# Patient Record
Sex: Male | Born: 1950 | Race: Black or African American | Hispanic: No | Marital: Single | Smoking: Former smoker
Health system: Southern US, Community
[De-identification: ages and names within clinical notes are randomized; demographics above are authoritative.]

## PROBLEM LIST (undated history)

## (undated) DIAGNOSIS — D1779 Benign lipomatous neoplasm of other sites: Secondary | ICD-10-CM

## (undated) DIAGNOSIS — E538 Deficiency of other specified B group vitamins: Secondary | ICD-10-CM

## (undated) DIAGNOSIS — K5900 Constipation, unspecified: Secondary | ICD-10-CM

## (undated) DIAGNOSIS — K5730 Diverticulosis of large intestine without perforation or abscess without bleeding: Secondary | ICD-10-CM

## (undated) DIAGNOSIS — B182 Chronic viral hepatitis C: Secondary | ICD-10-CM

## (undated) DIAGNOSIS — E7800 Pure hypercholesterolemia, unspecified: Secondary | ICD-10-CM

## (undated) DIAGNOSIS — N400 Enlarged prostate without lower urinary tract symptoms: Secondary | ICD-10-CM

## (undated) DIAGNOSIS — I10 Essential (primary) hypertension: Secondary | ICD-10-CM

## (undated) DIAGNOSIS — E785 Hyperlipidemia, unspecified: Secondary | ICD-10-CM

## (undated) HISTORY — DX: Essential (primary) hypertension: I10

## (undated) HISTORY — DX: Pure hypercholesterolemia, unspecified: E78.00

## (undated) HISTORY — DX: Hyperlipidemia, unspecified: E78.5

## (undated) HISTORY — DX: Diverticulosis of large intestine without perforation or abscess without bleeding: K57.30

## (undated) HISTORY — DX: Chronic viral hepatitis C: B18.2

## (undated) HISTORY — DX: Constipation, unspecified: K59.00

## (undated) HISTORY — DX: Benign prostatic hyperplasia without lower urinary tract symptoms: N40.0

## (undated) HISTORY — DX: Deficiency of other specified B group vitamins: E53.8

## (undated) HISTORY — DX: Benign lipomatous neoplasm of other sites: D17.79

---

## 2006-05-11 ENCOUNTER — Emergency Department (HOSPITAL_COMMUNITY): Admission: EM | Admit: 2006-05-11 | Discharge: 2006-05-11 | Payer: Self-pay | Admitting: Emergency Medicine

## 2006-05-14 ENCOUNTER — Emergency Department (HOSPITAL_COMMUNITY): Admission: EM | Admit: 2006-05-14 | Discharge: 2006-05-14 | Payer: Self-pay | Admitting: Family Medicine

## 2018-04-12 ENCOUNTER — Ambulatory Visit: Payer: Self-pay | Admitting: Interventional Cardiology

## 2021-04-01 ENCOUNTER — Encounter: Payer: Self-pay | Admitting: Gastroenterology

## 2021-04-29 ENCOUNTER — Encounter: Payer: Self-pay | Admitting: Gastroenterology

## 2021-04-29 ENCOUNTER — Ambulatory Visit (INDEPENDENT_AMBULATORY_CARE_PROVIDER_SITE_OTHER): Payer: Medicare Other | Admitting: Gastroenterology

## 2021-04-29 ENCOUNTER — Other Ambulatory Visit: Payer: Self-pay

## 2021-04-29 ENCOUNTER — Other Ambulatory Visit (INDEPENDENT_AMBULATORY_CARE_PROVIDER_SITE_OTHER): Payer: Medicare Other

## 2021-04-29 VITALS — BP 158/82 | HR 71 | Ht 68.0 in | Wt 176.0 lb

## 2021-04-29 DIAGNOSIS — Z800 Family history of malignant neoplasm of digestive organs: Secondary | ICD-10-CM | POA: Diagnosis not present

## 2021-04-29 DIAGNOSIS — K581 Irritable bowel syndrome with constipation: Secondary | ICD-10-CM

## 2021-04-29 DIAGNOSIS — R109 Unspecified abdominal pain: Secondary | ICD-10-CM | POA: Diagnosis not present

## 2021-04-29 LAB — CBC WITH DIFFERENTIAL/PLATELET
Basophils Absolute: 0 10*3/uL (ref 0.0–0.1)
Basophils Relative: 0.6 % (ref 0.0–3.0)
Eosinophils Absolute: 0.1 10*3/uL (ref 0.0–0.7)
Eosinophils Relative: 2.6 % (ref 0.0–5.0)
HCT: 42.3 % (ref 39.0–52.0)
Hemoglobin: 14.5 g/dL (ref 13.0–17.0)
Lymphocytes Relative: 36.4 % (ref 12.0–46.0)
Lymphs Abs: 1.8 10*3/uL (ref 0.7–4.0)
MCHC: 34.3 g/dL (ref 30.0–36.0)
MCV: 96.5 fl (ref 78.0–100.0)
Monocytes Absolute: 0.6 10*3/uL (ref 0.1–1.0)
Monocytes Relative: 11.7 % (ref 3.0–12.0)
Neutro Abs: 2.4 10*3/uL (ref 1.4–7.7)
Neutrophils Relative %: 48.7 % (ref 43.0–77.0)
Platelets: 233 10*3/uL (ref 150.0–400.0)
RBC: 4.39 Mil/uL (ref 4.22–5.81)
RDW: 12.4 % (ref 11.5–15.5)
WBC: 5 10*3/uL (ref 4.0–10.5)

## 2021-04-29 LAB — COMPREHENSIVE METABOLIC PANEL
ALT: 18 U/L (ref 0–53)
AST: 21 U/L (ref 0–37)
Albumin: 4.5 g/dL (ref 3.5–5.2)
Alkaline Phosphatase: 56 U/L (ref 39–117)
BUN: 14 mg/dL (ref 6–23)
CO2: 28 mEq/L (ref 19–32)
Calcium: 9.8 mg/dL (ref 8.4–10.5)
Chloride: 104 mEq/L (ref 96–112)
Creatinine, Ser: 0.88 mg/dL (ref 0.40–1.50)
GFR: 86.3 mL/min (ref >60.00)
Glucose, Bld: 80 mg/dL (ref 70–99)
Potassium: 4.2 mEq/L (ref 3.5–5.1)
Sodium: 140 mEq/L (ref 135–145)
Total Bilirubin: 1 mg/dL (ref 0.2–1.2)
Total Protein: 7.1 g/dL (ref 6.0–8.3)

## 2021-04-29 LAB — TSH: TSH: 1.45 u[IU]/mL (ref 0.35–5.50)

## 2021-04-29 LAB — C-REACTIVE PROTEIN: CRP: <1 mg/dL (ref 0.5–20.0)

## 2021-04-29 NOTE — Patient Instructions (Signed)
If you are age 71 or older, your body mass index should be between 23-30. Your Body mass index is 26.76 kg/m. If this is out of the aforementioned range listed, please consider follow up with your Primary Care Provider.  If you are age 89 or younger, your body mass index should be between 19-25. Your Body mass index is 26.76 kg/m. If this is out of the aformentioned range listed, please consider follow up with your Primary Care Provider.   ________________________________________________________  The [place] [facility] providers would like to encourage you to use [facility] to communicate with providers for non-urgent requests or questions.  Due to long hold times on the telephone, sending your provider a message by [facility] may be a faster and more efficient way to get a response.  Please allow 48 business hours for a response.  Please remember that this is for non-urgent requests.  _______________________________________________________  Please go to the lab on the 2nd floor [address] before you leave the office today.   Please purchase the following medications over the counter and take as directed: Dulcolax Monday Wednesday and Friday  Recall colon set for 08-2024. We will send a letter as it gets closer but you can call 06-2024 to see if we have the schedule out  You have been scheduled for a CT scan of the abdomen and pelvis at [facility] [place], [place] [address] 1st flood Radiology).   Please call in 3 days to schedule CT You are scheduled on          at            . You should arrive 15 minutes prior to your appointment time for registration. Please follow the written instructions below on the day of your exam:  WARNING: IF YOU ARE ALLERGIC TO IODINE/X-RAY DYE, PLEASE NOTIFY RADIOLOGY IMMEDIATELY AT [phone]! YOU WILL BE GIVEN A 13 HOUR PREMEDICATION PREP.  1) Do not eat or drink anything after        (4 hours prior to your test) 2) You have been given 2 bottles  of oral contrast to drink. The solution may taste better if refrigerated, but do NOT add ice or any other liquid to this solution. Shake well before drinking.    Drink 1 bottle of contrast @        (2 hours prior to your exam)  Drink 1 bottle of contrast @        (1 hour prior to your exam)  You may take any medications as prescribed with a small amount of water, if necessary. If you take any of the following medications: METFORMIN, GLUCOPHAGE, GLUCOVANCE, AVANDAMET, RIOMET, FORTAMET, [place] MET, JANUMET, GLUMETZA or METAGLIP, you MAY be asked to HOLD this medication 48 hours AFTER the exam.  The purpose of you drinking the oral contrast is to aid in the visualization of your intestinal tract. The contrast solution may cause some diarrhea. Depending on your individual set of symptoms, you may also receive an intravenous injection of x-ray contrast/dye. Plan on being at [facility] for 30 minutes or longer, depending on the type of exam you are having performed.  This test typically takes 30-45 minutes to complete.  If you have any questions regarding your exam or if you need to reschedule, you may call the CT department at [phone] between the hours of 8:00 am and 5:00 pm, Monday-Friday.  ________________________________________________________________________    Thank you,  Dr. Jackquline Denmark

## 2021-04-29 NOTE — Progress Notes (Signed)
Chief Complaint: GI eval  Referring Provider:  No ref. provider found      ASSESSMENT AND PLAN;   #1. Lower Abdo pain   #2. IBS-C. Neg colon 08/2019 except for inflammatory polyp, melanosis  #3. FH of Colon Ca (mom 10)  Plan: -CT AP with contrast -CBC, CMP, TSH, celiac, CRP. -Dulcolax MWF (3/day) -Recall Colon 08/2024 -FU after above is complete.   HPI:    Angel Conner is a 70 y.o. male  With HCV s/p Harvoni with SVR 2015, HTN, HLD, B12 def  C/O abdominal pain-mostly in the lower abdomen associated with abdominal bloating and constipation.  Has BMs 1 every day or every other day with occasionally pellet-like stools.  Has sensation of incomplete evacuation.  Has been taking Dulcolax 1/week and using MiraLAX on as-needed basis.  No melena or hematochezia.  There is some change in stool caliber.  Abdominal pain does get worse at times, crampy, abdomen becomes tender to touch.  No fever or chills.  No recent weight loss.  No nausea or vomiting.  Does have problems when he takes gluten in form of increased abdominal bloating.  No history suggestive of lactose intolerance.  Negative colonoscopy 08/18/2019 as below.   Past GI work-up: Colon[facility]/18/2019 [facility] 8 mm sigmoid polyp s/p polypectomy (Bx-inflammatory), internal hemorrhoids, moderate melanosis coli in the entire colon. Rpt 5 Yrs d/t family history  H/O HCV (GT1, Gd2/stage1 on liver Bx 05/2007, failed vertex open label study (Tele/pegasys/ribavirin), then treated with Harvoni 08/2013 with SVR. Nl Plt/alb/no cirrhosis (Dr Maceo Pro @ [facility]) Past Medical History:  Diagnosis Date   High cholesterol    Hypertension     History reviewed. No pertinent surgical history.  Family History  Problem Relation Age of Onset   Colon cancer Mother    Diabetes Father    Rectal cancer Neg Hx    Esophageal cancer Neg Hx    Stomach cancer Neg Hx     Social History   Tobacco Use   Smoking status: Former     Types: Cigarettes   Smokeless tobacco: Never   Tobacco comments:    Stopped smoking 30 years ago - 04/29/2021  Vaping Use   Vaping Use: Never used  Substance Use Topics   Alcohol use: Never   Drug use: Never    Current Outpatient Medications  Medication Sig Dispense Refill   atorvastatin (LIPITOR) 10 MG tablet Take 1 tablet by mouth at bedtime.     cyanocobalamin (,VITAMIN B-12,) 1000 MCG/ML injection Once Monthly     Multiple Vitamin (MULTIVITAMIN) tablet Take 1 tablet by mouth daily.     Omega 3 1200 MG CAPS Take by mouth. Once daily     hydrochlorothiazide (MICROZIDE) 12.5 MG capsule 1 tablet in the morning     losartan (COZAAR) 50 MG tablet Take 50 mg by mouth daily.     tadalafil (CIALIS) 5 MG tablet Take 1 tablet by mouth daily.     No current facility-administered medications for this visit.    No Known Allergies  Review of Systems:  Constitutional: Denies fever, chills, diaphoresis, appetite change and fatigue.  HEENT: Denies photophobia, eye pain, redness, hearing loss, ear pain, congestion, sore throat, rhinorrhea, sneezing, mouth sores, neck pain, neck stiffness and tinnitus.   Respiratory: Denies SOB, DOE, cough, chest tightness,  and wheezing.   Cardiovascular: Denies chest pain, palpitations and leg swelling.  Genitourinary: Denies dysuria, urgency, frequency, hematuria, flank pain and difficulty urinating.  Musculoskeletal: Denies  myalgias, back pain, joint swelling, arthralgias and gait problem.  Skin: No rash.  Neurological: Denies dizziness, seizures, syncope, weakness, light-headedness, numbness and headaches.  Hematological: Denies adenopathy. Easy bruising, personal or family bleeding history  Psychiatric/Behavioral: No anxiety or depression     Physical Exam:    BP (!) 158/82   Pulse 71   Ht 5\' 8"  (1.727 m)   Wt 176 lb (79.8 kg)   SpO2 98%   BMI 26.76 kg/m  Wt Readings from Last 3 Encounters:  04/29/21 176 lb (79.8 kg)   Constitutional:   Well-developed, in no acute distress. Psychiatric: Normal mood and affect. Behavior is normal. HEENT: Pupils normal.  Conjunctivae are normal. No scleral icterus. Cardiovascular: Normal rate, regular rhythm. No edema Pulmonary/chest: Effort normal and breath sounds normal. No wheezing, rales or rhonchi. Abdominal: Soft, nondistended. Nontender. Bowel sounds active throughout. There are no masses palpable. No hepatomegaly. Rectal: Deferred Neurological: Alert and oriented to person place and time. Skin: Skin is warm and dry. No rashes noted.     Carmell Austria, MD 04/29/2021, 11:21 AM  Cc: No ref. provider found

## 2021-05-01 LAB — CELIAC PANEL 10
Antigliadin Abs, IgA: 5 units (ref 0–19)
Endomysial IgA: NEGATIVE
Gliadin IgG: 1 units (ref 0–19)
IgA/Immunoglobulin A, Serum: 128 mg/dL (ref 61–437)
Tissue Transglut Ab: 2 U/mL (ref 0–5)
Transglutaminase IgA: <2 U/mL (ref 0–3)

## 2021-05-03 ENCOUNTER — Ambulatory Visit (HOSPITAL_BASED_OUTPATIENT_CLINIC_OR_DEPARTMENT_OTHER)
Admission: RE | Admit: 2021-05-03 | Discharge: 2021-05-03 | Disposition: A | Discharge status: Home / Self Care | Payer: Medicare Other | Source: Ambulatory Visit | Attending: Gastroenterology | Admitting: Gastroenterology

## 2021-05-03 ENCOUNTER — Encounter (HOSPITAL_BASED_OUTPATIENT_CLINIC_OR_DEPARTMENT_OTHER): Payer: Self-pay

## 2021-05-03 ENCOUNTER — Other Ambulatory Visit: Payer: Self-pay

## 2021-05-03 DIAGNOSIS — R109 Unspecified abdominal pain: Secondary | ICD-10-CM | POA: Insufficient documentation

## 2021-05-03 DIAGNOSIS — K581 Irritable bowel syndrome with constipation: Secondary | ICD-10-CM | POA: Insufficient documentation

## 2021-05-03 MED ORDER — IOHEXOL 300 MG/ML  SOLN
85.0000 mL | Freq: Once | INTRAMUSCULAR | Status: AC | PRN
  Administered 2021-05-03: 85 mL via INTRAVENOUS

## 2021-05-16 ENCOUNTER — Encounter: Payer: Self-pay | Admitting: Gastroenterology

## 2021-05-16 ENCOUNTER — Ambulatory Visit (INDEPENDENT_AMBULATORY_CARE_PROVIDER_SITE_OTHER): Payer: Medicare Other | Admitting: Gastroenterology

## 2021-05-16 ENCOUNTER — Other Ambulatory Visit: Payer: Self-pay

## 2021-05-16 ENCOUNTER — Ambulatory Visit: Payer: Medicare Other | Admitting: Gastroenterology

## 2021-05-16 VITALS — BP 128/78 | HR 76 | Ht 68.0 in | Wt 178.0 lb

## 2021-05-16 DIAGNOSIS — K581 Irritable bowel syndrome with constipation: Secondary | ICD-10-CM | POA: Diagnosis not present

## 2021-05-16 DIAGNOSIS — Z800 Family history of malignant neoplasm of digestive organs: Secondary | ICD-10-CM

## 2021-05-16 DIAGNOSIS — R1030 Lower abdominal pain, unspecified: Secondary | ICD-10-CM

## 2021-05-16 NOTE — Progress Notes (Signed)
Chief Complaint: GI eval  Referring Provider:  No ref. provider found      ASSESSMENT AND PLAN;   #1. Lower Abdo pain (resolved). Neg CT  AP 04/2021  #2. IBS-C. Neg colon 08/2019 except for inflammatory polyp, melanosis  #3. FH of Colon Ca (mom 25)  Plan: -Dulcolax MWF (3/week) -Recall Colon 08/2024. -FU PRN   HPI:    Angel Conner is a 70 y.o. male  With HCV s/p Harvoni with SVR 2015, HTN, HLD, B12 def  Doing great.  Constipation is much better as long as he takes Dulcolax 3/week  No nausea, vomiting, heartburn, regurgitation, odynophagia or dysphagia.  No significant diarrhea.  No melena or hematochezia. No unintentional weight loss. No further abdominal pain.  Here to discuss CT results.  Underwent CT Abdo/pelvis 05/05/2021 -No evidence of bowel obstruction. Normal appendix. No bowel wall thickening or inflammatory changes. -Prostatomegaly, suggesting BPH. Mildly thick-walled bladder, suggesting chronic bladder outlet obstruction.  He has been closely followed by urology.  Negative colonoscopy 08/18/2019 as below.   Nl CBC, CMP, TSH, celiac, CRP.  Past GI work-up: Colonoscopy 08/18/2019 @[facility] 8 mm sigmoid polyp s/p polypectomy (Bx-inflammatory), internal hemorrhoids, moderate melanosis coli in the entire colon. Rpt 5 Yrs d/t family history  CT Abdo/pelvis 05/05/2021 -No evidence of bowel obstruction. Normal appendix. No bowel wall thickening or inflammatory changes. -Prostatomegaly, suggesting BPH. Mildly thick-walled bladder, suggesting chronic bladder outlet obstruction.  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 Nl Plt/alb/no cirrhosis (Dr Angel Conner @ [facility]) Past Medical History:  Diagnosis Date   High cholesterol    Hypertension     History reviewed. No pertinent surgical history.  Family History  Problem Relation Age of Onset    Colon cancer Mother    Diabetes Father    Rectal cancer Neg Hx    Esophageal cancer Neg Hx    Stomach cancer Neg Hx     Social History   Tobacco Use   Smoking status: Former    Types: Cigarettes   Smokeless tobacco: Never   Tobacco comments:    Stopped smoking 30 years ago - 04/29/2021  Vaping Use   Vaping Use: Never used  Substance Use Topics   Alcohol use: Never   Drug use: Never    Current Outpatient Medications  Medication Sig Dispense Refill   atorvastatin (LIPITOR) 10 MG tablet Take 1 tablet by mouth at bedtime.     cyanocobalamin (,VITAMIN B-12,) 1000 MCG/ML injection Once Monthly     hydrochlorothiazide (MICROZIDE) 12.5 MG capsule 1 tablet in the morning     losartan (COZAAR) 50 MG tablet Take 50 mg by mouth daily.     Multiple Vitamin (MULTIVITAMIN) tablet Take 1 tablet by mouth daily.     Omega 3 1200 MG CAPS Take by mouth. Once daily     tadalafil (CIALIS) 5 MG tablet Take 1 tablet by mouth daily.     No current facility-administered medications for this visit.    No Known Allergies  Review of Systems:  neg     Physical Exam:    BP 128/78    Pulse 76    Ht 5\' 8"  (1.727 m)    Wt 178 lb (80.7 kg)    SpO2 98%    BMI 27.06 kg/m  Wt Readings from Last 3 Encounters:  05/16/21 178 lb (80.7 kg)  04/29/21 176 lb (79.8 kg)  Constitutional:  Well-developed, in no acute distress. Psychiatric: Normal mood and affect. Behavior is normal. HEENT: Pupils normal.  Conjunctivae are normal. No scleral icterus. Cardiovascular: Normal rate, regular rhythm. No edema Pulmonary/chest: Effort normal and breath sounds normal. No wheezing, rales or rhonchi. Abdominal: Soft, nondistended. Nontender. Bowel sounds active throughout. There are no masses palpable. No hepatomegaly. Rectal: Deferred Neurological: Alert and oriented to person place and time. Skin: Skin is warm and dry. No rashes noted.     Carmell Austria, MD 05/16/2021, 2:01 PM  Cc: No ref. provider found

## 2021-05-16 NOTE — Patient Instructions (Addendum)
If you are age 71 or older, your body mass index should be between 23-30. Your Body mass index is 27.06 kg/m. If this is out of the aforementioned range listed, please consider follow up with your Primary Care Provider.  If you are age 7 or younger, your body mass index should be between 19-25. Your Body mass index is 27.06 kg/m. If this is out of the aformentioned range listed, please consider follow up with your Primary Care Provider.   ________________________________________________________  The [place] [facility] providers would like to encourage you to use [facility] to communicate with providers for non-urgent requests or questions.  Due to long hold times on the telephone, sending your provider a message by [facility] may be a faster and more efficient way to get a response.  Please allow 48 business hours for a response.  Please remember that this is for non-urgent requests.  _______________________________________________________  Recall colonoscopy set for April 2026. We will sent a recall later as it gets closer but you can call us in February 2026 to see about scheduling this.  Dulcolax take as directed  Follow up as needed. Please call with any questions or concerns.  Thank you,  Dr. Jackquline Denmark

## 2021-07-28 NOTE — Progress Notes (Signed)
Cardiology Office Note:    Date:  07/29/2021   ID:  Angel Conner, DOB 07-May-1951, MRN [id number]  PCP:  Pcp, No  Cardiologist:  None   Referring MD: Wenda Low, MD   Chief Complaint  Patient presents with   Cardiac Valve Problem   Hypertension   Hyperlipidemia    History of Present Illness:    Angel Conner is a 71 y.o. male with a hx of hyperlipidemia, hypertension, hepatitis C, prior smoker, referred by Dr. Lysle Rubens to r/o CAD   This patient saw cardiologist in 2008.  He had a stress test performed, was told he had a thick heart, and was started on therapy for hypertension and cholesterol.  He has been compliant with this therapy since that time.  Recently, he has been noticing pulsatility in the right supraclavicular area.  This comes and goes.  He is able to see the pulsation in the mirror at times and then other times it is not present.  It is not present today.  It is not associated with palpitations or irregular heartbeat.  No family history of heart disease.  Father died of intestinal obstruction mother died of colon cancer.  Past Medical History:  Diagnosis Date   B12 deficiency    BPH (benign prostatic hyperplasia)    Constipation    Diverticula of colon    Hep C w/o coma, chronic (HCC)    High cholesterol    Hypercholesterolemia    Hyperlipidemia    Hypertension    Lipoma of groin     History reviewed. No pertinent surgical history.  Current Medications: Current Meds  Medication Sig   atorvastatin (LIPITOR) 10 MG tablet Take 1 tablet by mouth at bedtime.   cyanocobalamin (,VITAMIN B-12,) 1000 MCG/ML injection Once Monthly   hydrochlorothiazide (MICROZIDE) 12.5 MG capsule 1 tablet in the morning   hydrochlorothiazide (MICROZIDE) 12.5 MG capsule Take 12.5 mg by mouth 2 (two) times a week.   losartan (COZAAR) 50 MG tablet Take 50 mg by mouth daily.   Multiple Vitamin (MULTIVITAMIN) tablet Take 1 tablet by mouth daily.   Omega 3 1200 MG CAPS Take by  mouth. Once daily   tadalafil (CIALIS) 5 MG tablet Take 1 tablet by mouth daily.     Allergies:   Patient has no known allergies.   Social History   Socioeconomic History   Marital status: Single    Spouse name: Not on file   Number of children: 1   Years of education: Not on file   Highest education level: Not on file  Occupational History   Occupation: Retired  Tobacco Use   Smoking status: Former    Types: Cigarettes   Smokeless tobacco: Never   Tobacco comments:    Stopped smoking 30 years ago - 04/29/2021  Vaping Use   Vaping Use: Never used  Substance and Sexual Activity   Alcohol use: Never   Drug use: Never   Sexual activity: Not on file  Other Topics Concern   Not on file  Social History Narrative   Not on file   Social Determinants of Health   Financial Resource Strain: Not on file  Food Insecurity: Not on file  Transportation Needs: Not on file  Physical Activity: Not on file  Stress: Not on file  Social Connections: Not on file     Family History: The patient's family history includes Colon cancer in his mother; Diabetes in his father. There is no history of Rectal cancer, Esophageal cancer, or  Stomach cancer.  ROS:   Please see the history of present illness.    He gets routine colonoscopic follow-up given his mother's history.  He exercises more than 150 minutes/week.  He calls himself a gym rat.  His endurance is been stable.  No exertional related symptoms.  All other systems reviewed and are negative.  EKGs/Labs/Other Studies Reviewed:    The following studies were reviewed today: No prior cardiac imaging available to [place].  EKG:  EKG normal sinus rhythm, relatively low voltage, incomplete right bundle.  Left atrial abnormality and  otherwise normal.  Recent Labs: 04/29/2021: ALT 18; BUN 14; Creatinine, Ser 0.88; Hemoglobin 14.5; Platelets 233.0; Potassium 4.2; Sodium 140; TSH 1.45  Recent Lipid Panel No results found for: CHOL, TRIG, HDL,  CHOLHDL, VLDL, LDLCALC, LDLDIRECT  Physical Exam:    VS:  BP 124/80    Pulse 66    Ht '5\' 8"'$  (1.727 m)    Wt 177 lb 3.2 oz (80.4 kg)    SpO2 99%    BMI 26.94 kg/m     Wt Readings from Last 3 Encounters:  07/29/21 177 lb 3.2 oz (80.4 kg)  05/16/21 178 lb (80.7 kg)  04/29/21 176 lb (79.8 kg)     GEN: Healthy appearance. No acute distress HEENT: Normal NECK: No JVD. LYMPHATICS: No lymphadenopathy CARDIAC: 2/6 right upper sternal systolic murmur that increases slightly with assuming recumbency.  No diastolic murmur. RRR S4 but no S3 gallop, or edema. VASCULAR:  Normal Pulses. No bruits. RESPIRATORY:  Clear to auscultation without rales, wheezing or rhonchi  ABDOMEN: Soft, non-tender, non-distended, No pulsatile mass, MUSCULOSKELETAL: No deformity  SKIN: Warm and dry NEUROLOGIC:  Alert and oriented x 3 PSYCHIATRIC:  Normal affect   ASSESSMENT:    1. Heart murmur, systolic   2. Hyperlipidemia, unspecified hyperlipidemia type   3. Primary hypertension   4. Hypertensive heart disease without heart failure   5. Murmur    PLAN:    In order of problems listed above:  Suspect aortic valve sclerosis/stenosis.  Rule out LV outflow tract obstruction.  2D Doppler echocardiogram. LDL is 79 and should probably be targeted slightly lower.  Coronary calcium score will be done to survey burden of atherosclerosis and if significant elevation, escalation in intensity will be pursued.  If calcium score is 0, no changes in therapy will be made. Blood pressure control is excellent on HCTZ and Cozaar. 2D Doppler echocardiogram will help to assess wall thickness and explain etiology of systolic murmur.    Medication Adjustments/Labs and Tests Ordered: Current medicines are reviewed at length with the patient today.  Concerns regarding medicines are outlined above.  Orders Placed This Encounter  Procedures   CT CARDIAC SCORING (SELF PAY ONLY)   EKG 12-Lead   ECHOCARDIOGRAM COMPLETE   No orders  of the defined types were placed in this encounter.   Patient Instructions  Medication Instructions:  Your physician recommends that you continue on your current medications as directed. Please refer to the Current Medication list given to you today.  *If you need a refill on your cardiac medications before your next appointment, please call your pharmacy*   Lab Work: None If you have labs (blood work) drawn today and your tests are completely normal, you will receive your results only by: [place] (if you have MyChart) OR A paper copy in the mail If you have any lab test that is abnormal or we need to change your treatment, we will call you to  review the results.   Testing/Procedures: Your physician has requested that you have an echocardiogram. Echocardiography is a painless test that uses sound waves to create images of your heart. It provides your doctor with information about the size and shape of your heart and how well your hearts chambers and valves are working. This procedure takes approximately one hour. There are no restrictions for this procedure.  Your physician recommends that you have a Calcium Score performed.   Follow-Up: At [facility], you and your health needs are our priority.  As part of our continuing mission to provide you with exceptional heart care, we have created designated Provider Care Teams.  These Care Teams include your primary Cardiologist (physician) and Advanced Practice Providers (APPs -  Physician Assistants and Nurse Practitioners) who all work together to provide you with the care you need, when you need it.  We recommend signing up for the patient portal called "MyChart".  Sign up information is provided on this After Visit Summary.  MyChart is used to connect with patients for Virtual Visits (Telemedicine).  Patients are able to view lab/test results, encounter notes, upcoming appointments, etc.  Non-urgent messages can be sent to your  provider as well.   To learn more about what you can do with MyChart, go to [email or web address].    Your next appointment:   1 year(s)  The format for your next appointment:   In Person  Provider:   [facility]. Blenda Bridegroom, MD    Other Instructions     Signed, Sinclair Grooms, MD  07/29/2021 10:23 AM    [place]

## 2021-07-29 ENCOUNTER — Other Ambulatory Visit: Payer: Self-pay

## 2021-07-29 ENCOUNTER — Ambulatory Visit (INDEPENDENT_AMBULATORY_CARE_PROVIDER_SITE_OTHER): Payer: Medicare Other | Admitting: Interventional Cardiology

## 2021-07-29 ENCOUNTER — Encounter: Payer: Self-pay | Admitting: Interventional Cardiology

## 2021-07-29 VITALS — BP 124/80 | HR 66 | Ht 68.0 in | Wt 177.2 lb

## 2021-07-29 DIAGNOSIS — R011 Cardiac murmur, unspecified: Secondary | ICD-10-CM | POA: Diagnosis not present

## 2021-07-29 DIAGNOSIS — E785 Hyperlipidemia, unspecified: Secondary | ICD-10-CM

## 2021-07-29 DIAGNOSIS — I10 Essential (primary) hypertension: Secondary | ICD-10-CM | POA: Diagnosis not present

## 2021-07-29 DIAGNOSIS — I119 Hypertensive heart disease without heart failure: Secondary | ICD-10-CM | POA: Diagnosis not present

## 2021-07-29 NOTE — Patient Instructions (Signed)
Medication Instructions:  ?Your physician recommends that you continue on your current medications as directed. Please refer to the Current Medication list given to you today. ? ?*If you need a refill on your cardiac medications before your next appointment, please call your pharmacy* ? ? ?Lab Work: ?None ?If you have labs (blood work) drawn today and your tests are completely normal, you will receive your results only by: ?MyChart Message (if you have MyChart) OR ?A paper copy in the mail ?If you have any lab test that is abnormal or we need to change your treatment, we will call you to review the results. ? ? ?Testing/Procedures: ?Your physician has requested that you have an echocardiogram. Echocardiography is a painless test that uses sound waves to create images of your heart. It provides your doctor with information about the size and shape of your heart and how well your heart?s chambers and valves are working. This procedure takes approximately one hour. There are no restrictions for this procedure. ? ?Your physician recommends that you have a Calcium Score performed. ? ? ?Follow-Up: ?At [facility], you and your health needs are our priority.  As part of our continuing mission to provide you with exceptional heart care, we have created designated Provider Care Teams.  These Care Teams include your primary Cardiologist (physician) and Advanced Practice Providers (APPs -  Physician Assistants and Nurse Practitioners) who all work together to provide you with the care you need, when you need it. ? ?We recommend signing up for the patient portal called "MyChart".  Sign up information is provided on this After Visit Summary.  MyChart is used to connect with patients for Virtual Visits (Telemedicine).  Patients are able to view lab/test results, encounter notes, upcoming appointments, etc.  Non-urgent messages can be sent to your provider as well.   ?To learn more about what you can do with MyChart, go to  [email or web address].   ? ?Your next appointment:   ?1 year(s) ? ?The format for your next appointment:   ?In Person ? ?Provider:   ?[facility]. Blenda Bridegroom, MD  ? ? ?Other Instructions ?  ?

## 2021-08-08 ENCOUNTER — Ambulatory Visit (HOSPITAL_COMMUNITY): Payer: Medicare Other | Attending: Cardiovascular Disease

## 2021-08-08 ENCOUNTER — Ambulatory Visit (INDEPENDENT_AMBULATORY_CARE_PROVIDER_SITE_OTHER)
Admission: RE | Admit: 2021-08-08 | Discharge: 2021-08-08 | Disposition: A | Discharge status: Home / Self Care | Payer: Self-pay | Source: Ambulatory Visit | Attending: Interventional Cardiology | Admitting: Interventional Cardiology

## 2021-08-08 ENCOUNTER — Other Ambulatory Visit: Payer: Self-pay

## 2021-08-08 DIAGNOSIS — R011 Cardiac murmur, unspecified: Secondary | ICD-10-CM | POA: Diagnosis present

## 2021-08-08 DIAGNOSIS — I10 Essential (primary) hypertension: Secondary | ICD-10-CM

## 2021-08-08 DIAGNOSIS — I119 Hypertensive heart disease without heart failure: Secondary | ICD-10-CM

## 2021-08-08 DIAGNOSIS — E785 Hyperlipidemia, unspecified: Secondary | ICD-10-CM

## 2021-08-08 LAB — ECHOCARDIOGRAM COMPLETE
AR max vel: 1.67 cm2
AV Area VTI: 1.75 cm2
AV Area mean vel: 1.59 cm2
AV Mean grad: 8 mmHg
AV Peak grad: 15.5 mmHg
Ao pk vel: 1.97 m/s
Area-P 1/2: 6.37 cm2
S' Lateral: 2 cm

## 2021-08-12 NOTE — Progress Notes (Signed)
? ?Cardiology Office Note   ? ?Date:  08/14/2021  ? ?ID:  Angel Conner, DOB [birth year], MRN [id number] ? ? ?PCP:  Pcp, No ?  ?Angel Conner  ?Cardiologist:  Angel Grooms, MD   ?Advanced Practice Provider:  No care team member to display ?Electrophysiologist:  None  ? ?[id number]}  ? ?No chief complaint on file. ? ? ?History of Present Illness:  ?Angel Conner is a 71 y.o. male with a hx of hyperlipidemia, hypertension, hepatitis C, prior smoker,  ? ?Patient saw Dr. Tamala Conner 07/29/21 because of noticing pulsatile area right supraclavicular area. No chest pain, palpitations. He ordered echo -normal LVEF mild AS-plan repeat in 2 yrs. coronary calcium score 590. Recommend intensify statin 40 mg daily and ASA 81 mg daily. ? ?Patient comes in for f/u. Reviewed Coronary calcium score and echo in detail. All questions answered. Exercising at gym-treadmill 40-50/m and stairmaster 10 min and lifts weights. Does 6 days a week. Denies any chest pain, dyspnea, palpitations, dizziness or presyncope.  ? ? ? ?Past Medical History:  ?Diagnosis Date  ? B12 deficiency   ? BPH (benign prostatic hyperplasia)   ? Constipation   ? Diverticula of colon   ? Hep C w/o coma, chronic (HCC)   ? High cholesterol   ? Hypercholesterolemia   ? Hyperlipidemia   ? Hypertension   ? Lipoma of groin   ? ? ?History reviewed. No pertinent surgical history. ? ?Current Medications: ?Current Meds  ?Medication Sig  ? aspirin EC 81 MG tablet Take 1 tablet (81 mg total) by mouth daily. Swallow whole.  ? cyanocobalamin (,VITAMIN B-12,) 1000 MCG/ML injection Once Monthly  ? hydrochlorothiazide (MICROZIDE) 12.5 MG capsule Take 12.5 mg by mouth 2 (two) times a week.  ? losartan (COZAAR) 50 MG tablet Take 50 mg by mouth daily.  ? Multiple Vitamin (MULTIVITAMIN) tablet Take 1 tablet by mouth daily.  ? Omega 3 1200 MG CAPS Take by mouth. Once daily  ? tadalafil (CIALIS) 5 MG tablet Take 1 tablet by mouth daily.  ? [DISCONTINUED] atorvastatin  (LIPITOR) 10 MG tablet Take 1 tablet by mouth at bedtime.  ?  ? ?Allergies:   Patient has no known allergies.  ? ?Social History  ? ?Socioeconomic History  ? Marital status: Single  ?  Spouse name: Not on file  ? Number of children: 1  ? Years of education: Not on file  ? Highest education level: Not on file  ?Occupational History  ? Occupation: Retired  ?Tobacco Use  ? Smoking status: Former  ?  Types: Cigarettes  ? Smokeless tobacco: Never  ? Tobacco comments:  ?  Stopped smoking 30 years ago - 04/29/2021  ?Vaping Use  ? Vaping Use: Never used  ?Substance and Sexual Activity  ? Alcohol use: Never  ? Drug use: Never  ? Sexual activity: Not on file  ?Other Topics Concern  ? Not on file  ?Social History Narrative  ? Not on file  ? ?Social Determinants of Health  ? ?Financial Resource Strain: Not on file  ?Food Insecurity: Not on file  ?Transportation Needs: Not on file  ?Physical Activity: Not on file  ?Stress: Not on file  ?Social Connections: Not on file  ?  ? ?Family History:  The patient's  family history includes Colon cancer in his mother; Diabetes in his father.  ? ?ROS:   ?Please see the history of present illness.    ?ROS All other systems reviewed and are negative. ? ? ?  PHYSICAL EXAM:   ?VS:  BP 132/70   Pulse 76   Ht '5\' 8"'$  (1.727 m)   Wt 173 lb 9.6 oz (78.7 kg)   SpO2 98%   BMI 26.40 kg/m?   ?Physical Exam  ?GEN: Well nourished, well developed, in no acute distress  ?Neck: no JVD, carotid bruits, or masses ?Cardiac:RRR; 2/6 systolic murmur LSB ?Respiratory:  clear to auscultation bilaterally, normal work of breathing ?GI: soft, nontender, nondistended, + BS ?Ext: without cyanosis, clubbing, or edema, Good distal pulses bilaterally ?Neuro:  Alert and Oriented x 3 ?Psych: euthymic mood, full affect ? ?Wt Readings from Last 3 Encounters:  ?08/14/21 173 lb 9.6 oz (78.7 kg)  ?07/29/21 177 lb 3.2 oz (80.4 kg)  ?05/16/21 178 lb (80.7 kg)  ?  ? ? ?Studies/Labs Reviewed:  ? ?EKG:  EKG is not ordered today   ? ?Recent Labs: ?04/29/2021: ALT 18; BUN 14; Creatinine, Ser 0.88; Hemoglobin 14.5; Platelets 233.0; Potassium 4.2; Sodium 140; TSH 1.45  ? ?Lipid Panel ?No results found for: CHOL, TRIG, HDL, CHOLHDL, VLDL, LDLCALC, LDLDIRECT ? ?Additional studies/ records that were reviewed today include:  ?Coronary calcium score 08/08/21 ?EXAM: ?Coronary Calcium Score ?  ?TECHNIQUE: ?A gated, non-contrast computed tomography scan of the heart was ?performed using 56m slice thickness. Axial images were analyzed on a ?dedicated workstation. Calcium scoring of the coronary arteries was ?performed using the Agatston method. ?  ?FINDINGS: ?Coronary Calcium Score: ?  ?Left main: 0 ?  ?Left anterior descending artery: 234 ?  ?Left circumflex artery: 173 ?  ?Right coronary artery: 183 ?  ?Total: 590 ?  ?Percentile: 87 ?  ?Pericardium: Normal. ?  ?Ascending Aorta: Normal caliber. ?  ?[facility]-cardiac: [facility] ?  ?IMPRESSION: ?Coronary calcium score of 590. This was 854percentile for age-, ?race-, and sex-matched controls. ?  ?Echo 08/08/21 ?IMPRESSIONS  ? ? ? 1. Left ventricular ejection fraction, by estimation, is 60 to 65%. The  ?left ventricle has normal function. The left ventricle has no regional  ?wall motion abnormalities. There is mild concentric left ventricular  ?hypertrophy. Left ventricular diastolic  ?parameters are consistent with Grade I diastolic dysfunction (impaired  ?relaxation).  ? 2. Right ventricular systolic function is normal. The right ventricular  ?size is normal. Tricuspid regurgitation signal is inadequate for assessing  ?PA pressure.  ? 3. The mitral valve is normal in structure. No evidence of mitral valve  ?regurgitation.  ? 4. The right and noncoronary aortic valve cusps have severely restricted  ?motion, but the left coronary cusp moves freely. The aortic valve is  ?tricuspid. There is moderate calcification of the aortic valve. There is  ?moderate thickening of the aortic   ?valve. Aortic valve regurgitation is not visualized. Mild aortic valve  ?stenosis.  ? ? ?Risk Assessment/Calculations:   ?  ? ? ? ? ?ASSESSMENT:   ? ?1. Coronary artery disease involving native coronary artery of native heart without angina pectoris   ?2. Aortic valve stenosis, etiology of cardiac valve disease unspecified   ?3. Essential hypertension   ?4. Hyperlipidemia, unspecified hyperlipidemia type   ?5. Primary hypertension   ? ? ? ?PLAN:  ?In order of problems listed above: ? ?CAD coronary calcium score 590-87% age/race/sex matched controls, 234 LAD, 173 Cfx, 183 RCA. Dr. STamala Julianrecommends ETT, increase lipitor 40 mg once daily and ASA 81 mg daily. Reviewed tests in detail with patient. Exercises regularly without symptoms ? ?Aortic stenosis-mild on echo repeat in 2 yrs. ? ?  HTN BP controlled on losartan & HCTZ ? ?HLD LDL 61 11/2020 but increasing lipitor 40 mg repeat FLP and LFT's in 3 months. ? ?Shared Decision Making/Informed Consent   ?   ?  ?  ?   ? ?Medication Adjustments/Labs and Tests Ordered: ?Current medicines are reviewed at length with the patient today.  Concerns regarding medicines are outlined above.  Medication changes, Labs and Tests ordered today are listed in the Patient Instructions below. ?Patient Instructions  ?Medication Instructions:  ?Your physician has recommended you make the following change in your medication:  ? ?INCREASE: Atorvastatin to '40mg'$  daily ?START: Aspirin '81mg'$  daily ? ?*If you need a refill on your cardiac medications before your next appointment, please call your pharmacy* ? ? ?Lab Work: ?Your physician recommends that you return for a FASTING lipid profile in 3 months ? ?If you have labs (blood work) drawn today and your tests are completely normal, you will receive your results only by: ?MyChart Message (if you have MyChart) OR ?A paper copy in the mail ?If you have any lab test that is abnormal or we need to change your treatment, we will call you to review the  results. ? ? ?Testing/Procedures: ?Your physician has requested that you have an exercise tolerance test. For further information please visit [email or web address]. Please also follow instruction sheet, as given. ? ? ?Fo

## 2021-08-14 ENCOUNTER — Ambulatory Visit (INDEPENDENT_AMBULATORY_CARE_PROVIDER_SITE_OTHER): Payer: Medicare Other | Admitting: Physician Assistant

## 2021-08-14 ENCOUNTER — Encounter: Payer: Self-pay | Admitting: Physician Assistant

## 2021-08-14 ENCOUNTER — Other Ambulatory Visit: Payer: Self-pay

## 2021-08-14 VITALS — BP 132/70 | HR 76 | Ht 68.0 in | Wt 173.6 lb

## 2021-08-14 DIAGNOSIS — I10 Essential (primary) hypertension: Secondary | ICD-10-CM | POA: Insufficient documentation

## 2021-08-14 DIAGNOSIS — I2510 Atherosclerotic heart disease of native coronary artery without angina pectoris: Secondary | ICD-10-CM | POA: Insufficient documentation

## 2021-08-14 DIAGNOSIS — E785 Hyperlipidemia, unspecified: Secondary | ICD-10-CM

## 2021-08-14 DIAGNOSIS — I350 Nonrheumatic aortic (valve) stenosis: Secondary | ICD-10-CM

## 2021-08-14 MED ORDER — ASPIRIN EC 81 MG PO TBEC: 81.0000 mg | DELAYED_RELEASE_TABLET | Freq: Every day | ORAL | 3 refills | Status: DC

## 2021-08-14 MED ORDER — ATORVASTATIN CALCIUM 40 MG PO TABS: 40.0000 mg | ORAL_TABLET | Freq: Every day | ORAL | 3 refills | Status: DC

## 2021-08-14 NOTE — Patient Instructions (Signed)
Medication Instructions:  ?Your physician has recommended you make the following change in your medication:  ? ?INCREASE: Atorvastatin to '40mg'$  daily ?START: Aspirin '81mg'$  daily ? ?*If you need a refill on your cardiac medications before your next appointment, please call your pharmacy* ? ? ?Lab Work: ?Your physician recommends that you return for a FASTING lipid profile in 3 months ? ?If you have labs (blood work) drawn today and your tests are completely normal, you will receive your results only by: ?MyChart Message (if you have MyChart) OR ?A paper copy in the mail ?If you have any lab test that is abnormal or we need to change your treatment, we will call you to review the results. ? ? ?Testing/Procedures: ?Your physician has requested that you have an exercise tolerance test. For further information please visit [email or web address]. Please also follow instruction sheet, as given. ? ? ?Follow-Up: ?At [facility], you and your health needs are our priority.  As part of our continuing mission to provide you with exceptional heart care, we have created designated Provider Care Teams.  These Care Teams include your primary Cardiologist (physician) and Advanced Practice Providers (APPs -  Physician Assistants and Nurse Practitioners) who all work together to provide you with the care you need, when you need it. ? ?We recommend signing up for the patient portal called "MyChart".  Sign up information is provided on this After Visit Summary.  MyChart is used to connect with patients for Virtual Visits (Telemedicine).  Patients are able to view lab/test results, encounter notes, upcoming appointments, etc.  Non-urgent messages can be sent to your provider as well.   ?To learn more about what you can do with MyChart, go to [email or web address].   ? ?Your next appointment:   ?3-4 month(s) or 1st available ? ?The format for your next appointment:   ?In Person ? ?Provider:   ?Sinclair Grooms, MD   ?  ?

## 2021-08-15 ENCOUNTER — Encounter: Payer: Self-pay | Admitting: Interventional Cardiology

## 2021-09-01 ENCOUNTER — Encounter: Payer: Self-pay | Admitting: Interventional Cardiology

## 2021-09-05 ENCOUNTER — Ambulatory Visit (INDEPENDENT_AMBULATORY_CARE_PROVIDER_SITE_OTHER): Payer: Medicare Other

## 2021-09-05 DIAGNOSIS — I2510 Atherosclerotic heart disease of native coronary artery without angina pectoris: Secondary | ICD-10-CM

## 2021-09-05 DIAGNOSIS — I350 Nonrheumatic aortic (valve) stenosis: Secondary | ICD-10-CM

## 2021-09-05 DIAGNOSIS — I10 Essential (primary) hypertension: Secondary | ICD-10-CM | POA: Diagnosis not present

## 2021-09-05 DIAGNOSIS — E785 Hyperlipidemia, unspecified: Secondary | ICD-10-CM | POA: Diagnosis not present

## 2021-09-05 LAB — EXERCISE TOLERANCE TEST
Angina Index: 0
Duke Treadmill Score: 5
Estimated workload: 11
Exercise duration (min): 10 min
Exercise duration (sec): 0 s
MPHR: 148 {beats}/min
Peak HR: 129 {beats}/min
Percent HR: 87 %
RPE: 15
Rest HR: 71 {beats}/min
ST Depression (mm): 1 mm

## 2021-11-07 ENCOUNTER — Encounter: Payer: Self-pay | Admitting: Interventional Cardiology

## 2021-11-08 MED ORDER — ASPIRIN 81 MG PO TBEC: 81.0000 mg | DELAYED_RELEASE_TABLET | ORAL | Status: AC

## 2021-11-13 ENCOUNTER — Other Ambulatory Visit: Payer: Medicare Other

## 2021-11-13 DIAGNOSIS — E785 Hyperlipidemia, unspecified: Secondary | ICD-10-CM

## 2021-11-13 DIAGNOSIS — I350 Nonrheumatic aortic (valve) stenosis: Secondary | ICD-10-CM

## 2021-11-13 DIAGNOSIS — I10 Essential (primary) hypertension: Secondary | ICD-10-CM

## 2021-11-13 DIAGNOSIS — I2510 Atherosclerotic heart disease of native coronary artery without angina pectoris: Secondary | ICD-10-CM

## 2021-11-14 LAB — HEPATIC FUNCTION PANEL
ALT: 21 IU/L (ref 0–44)
AST: 25 IU/L (ref 0–40)
Albumin: 4.7 g/dL (ref 3.7–4.7)
Alkaline Phosphatase: 61 IU/L (ref 44–121)
Bilirubin Total: 1.3 mg/dL — ABNORMAL HIGH (ref 0.0–1.2)
Bilirubin, Direct: 0.35 mg/dL (ref 0.00–0.40)
Total Protein: 6.9 g/dL (ref 6.0–8.5)

## 2021-11-14 LAB — LIPID PANEL
Chol/HDL Ratio: 2.6 ratio (ref 0.0–5.0)
Cholesterol, Total: 125 mg/dL (ref 100–199)
HDL: 49 mg/dL (ref >39)
LDL Chol Calc (NIH): 63 mg/dL (ref 0–99)
Triglycerides: 58 mg/dL (ref 0–149)
VLDL Cholesterol Cal: 13 mg/dL (ref 5–40)

## 2021-12-03 NOTE — Progress Notes (Signed)
Cardiology Office Note:    Date:  12/05/2021   ID:  Angel Conner, DOB 1950-07-24, MRN [id number]  PCP:  Merryl Hacker, No  Cardiologist:  Sinclair Grooms, MD   Referring MD: No ref. provider found   Chief Complaint  Patient presents with   Coronary Artery Disease   Cardiac Valve Problem    History of Present Illness:    Angel Conner is a 71 y.o. male with a hx of hyperlipidemia, primary hypertension, hepatitis C, prior smoker, CAD (CAC score > 500), and mild aortic stenosis.  The patient is asymptomatic.  He is highly active and exercises at the gym multiple days per week.  No symptoms.  We spent significant time discussing his underlying cardiac status.  He does have asymptomatic coronary disease.  He denies heart failure symptoms.  He has not had syncope, palpitations, or other complaints.  Risk factors include hyperlipidemia, hypertension,  Past Medical History:  Diagnosis Date   B12 deficiency    BPH (benign prostatic hyperplasia)    Constipation    Diverticula of colon    Hep C w/o coma, chronic (HCC)    High cholesterol    Hypercholesterolemia    Hyperlipidemia    Hypertension    Lipoma of groin     History reviewed. No pertinent surgical history.  Current Medications: Current Meds  Medication Sig   aspirin EC 81 MG tablet Take 1 tablet (81 mg total) by mouth 3 (three) times a week. Swallow whole. Take on Mondays, Wednesdays, and Fridays.   atorvastatin (LIPITOR) 40 MG tablet Take 1 tablet (40 mg total) by mouth at bedtime.   cyanocobalamin (,VITAMIN B-12,) 1000 MCG/ML injection Once Monthly   losartan (COZAAR) 50 MG tablet Take 50 mg by mouth daily.   Multiple Vitamin (MULTIVITAMIN) tablet Take 1 tablet by mouth daily.   Omega 3 1200 MG CAPS Take by mouth. Once daily   tadalafil (CIALIS) 5 MG tablet Take 1 tablet by mouth daily.     Allergies:   Patient has no known allergies.   Social History   Socioeconomic History   Marital status: Single    Spouse name:  Not on file   Number of children: 1   Years of education: Not on file   Highest education level: Not on file  Occupational History   Occupation: Retired  Tobacco Use   Smoking status: Former    Types: Cigarettes   Smokeless tobacco: Never   Tobacco comments:    Stopped smoking 30 years ago - 04/29/2021  Vaping Use   Vaping Use: Never used  Substance and Sexual Activity   Alcohol use: Never   Drug use: Never   Sexual activity: Not on file  Other Topics Concern   Not on file  Social History Narrative   Not on file   Social Determinants of Health   Financial Resource Strain: Not on file  Food Insecurity: Not on file  Transportation Needs: Not on file  Physical Activity: Not on file  Stress: Not on file  Social Connections: Not on file     Family History: The patient's family history includes Colon cancer in his mother; Diabetes in his father. There is no history of Rectal cancer, Esophageal cancer, or Stomach cancer.  ROS:   Please see the history of present illness.    Asymptomatic without claudication, neurological complaints. all other systems reviewed and are negative.  EKGs/Labs/Other Studies Reviewed:    The following studies were reviewed today:  Coronary calcium  score 08/08/21 EXAM: Coronary Calcium Score   TECHNIQUE: A gated, non-contrast computed tomography scan of the heart was performed using 47m slice thickness. Axial images were analyzed on a dedicated workstation. Calcium scoring of the coronary arteries was performed using the Agatston method.   FINDINGS: Coronary Calcium Score: 07/2021   Left main: 0   Left anterior descending artery: 234   Left circumflex artery: 173   Right coronary artery: 183   Total: 590   Percentile: 87   Pericardium: Normal.   Ascending Aorta: Normal caliber.   [facility]-cardiac: [facility]   IMPRESSION: Coronary calcium score of 590. This was 815percentile for age-, race-,  and sex-matched controls.   Echo 08/08/21 IMPRESSIONS   1. Left ventricular ejection fraction, by estimation, is 60 to 65%. The  left ventricle has normal function. The left ventricle has no regional  wall motion abnormalities. There is mild concentric left ventricular  hypertrophy. Left ventricular diastolic  parameters are consistent with Grade I diastolic dysfunction (impaired  relaxation).   2. Right ventricular systolic function is normal. The right ventricular  size is normal. Tricuspid regurgitation signal is inadequate for assessing  PA pressure.   3. The mitral valve is normal in structure. No evidence of mitral valve  regurgitation.   4. The right and noncoronary aortic valve cusps have severely restricted  motion, but the left coronary cusp moves freely. The aortic valve is  tricuspid. There is moderate calcification of the aortic valve. There is  moderate thickening of the aortic  valve. Aortic valve regurgitation is not visualized. Mild aortic valve  stenosis.   EKG:  EKG not repeated  Recent Labs: 04/29/2021: BUN 14; Creatinine, Ser 0.88; Hemoglobin 14.5; Platelets 233.0; Potassium 4.2; Sodium 140; TSH 1.45 11/13/2021: ALT 21  Recent Lipid Panel    Component Value Date/Time   CHOL 125 11/13/2021 0834   TRIG 58 11/13/2021 0834   HDL 49 11/13/2021 0834   CHOLHDL 2.6 11/13/2021 0834   LDLCALC 63 11/13/2021 0834    Physical Exam:    VS:  BP 122/64   Pulse 76   Ht 5' 7.5" (1.715 m)   Wt 175 lb (79.4 kg)   SpO2 96%   BMI 27.00 kg/m     Wt Readings from Last 3 Encounters:  12/05/21 175 lb (79.4 kg)  08/14/21 173 lb 9.6 oz (78.7 kg)  07/29/21 177 lb 3.2 oz (80.4 kg)     GEN: . No acute distress HEENT: Normal NECK: No JVD. LYMPHATICS: No lymphadenopathy CARDIAC: 2/6 right upper sternal systolic murmur. RRR no gallop, or edema. VASCULAR:  Normal Pulses. No bruits. RESPIRATORY:  Clear to auscultation without rales, wheezing or rhonchi  ABDOMEN: Soft,  non-tender, non-distended, No pulsatile mass, MUSCULOSKELETAL: No deformity  SKIN: Warm and dry NEUROLOGIC:  Alert and oriented x 3 PSYCHIATRIC:  Normal affect   ASSESSMENT:    1. Coronary artery disease involving native coronary artery of native heart without angina pectoris   2. Aortic valve stenosis, etiology of cardiac valve disease unspecified   3. Hyperlipidemia, unspecified hyperlipidemia type   4. Primary hypertension    PLAN:    In order of problems listed above:  Aggressive risk factor modification discussed.  Important issues stressed or continued aerobic activity greater than 1553mutes/week, lipid-lowering, aspirin 81 mg/day.  Report any new symptoms that limit physical activity. Needs repeat echocardiogram in about 2 years.  I have set him up to follow-up with Dr. MaAnnia Belts his  new cardiologist in 1 year. Continue aggressive lipid management Excellent blood pressure control on current therapy.  Overall education and awareness concerning primary/secondary risk prevention was discussed in detail: LDL less than 70, hemoglobin A1c less than 7, blood pressure target less than 130/80 mmHg, >150 minutes of moderate aerobic activity per week, avoidance of smoking, weight control (via diet and exercise), and continued surveillance/management of/for obstructive sleep apnea.  Significant time spent in counseling concerning his new diagnoses of aortic stenosis and asymptomatic coronary artery disease.  This extended office visit included greater than 50% of the time in counseling.  Medication Adjustments/Labs and Tests Ordered: Current medicines are reviewed at length with the patient today.  Concerns regarding medicines are outlined above.  No orders of the defined types were placed in this encounter.  No orders of the defined types were placed in this encounter.   Patient Instructions  Medication Instructions:  Your physician recommends that you continue on your  current medications as directed. Please refer to the Current Medication list given to you today.  *If you need a refill on your cardiac medications before your next appointment, please call your pharmacy*  Lab Work: NONE  Testing/Procedures: NONE  Follow-Up: At [facility], you and your health needs are our priority.  As part of our continuing mission to provide you with exceptional heart care, we have created designated Provider Care Teams.  These Care Teams include your primary Cardiologist (physician) and Advanced Practice Providers (APPs -  Physician Assistants and Nurse Practitioners) who all work together to provide you with the care you need, when you need it.  Your next appointment:   1 year(s)  The format for your next appointment:   In Person  Provider:   Rudean Haskell, MD  Important Information About Sugar         Signed, Sinclair Grooms, MD  12/05/2021 8:21 AM    [place]

## 2021-12-05 ENCOUNTER — Ambulatory Visit (INDEPENDENT_AMBULATORY_CARE_PROVIDER_SITE_OTHER): Payer: Medicare Other | Admitting: Interventional Cardiology

## 2021-12-05 ENCOUNTER — Encounter: Payer: Self-pay | Admitting: Interventional Cardiology

## 2021-12-05 VITALS — BP 122/64 | HR 76 | Ht 67.5 in | Wt 175.0 lb

## 2021-12-05 DIAGNOSIS — I10 Essential (primary) hypertension: Secondary | ICD-10-CM | POA: Diagnosis not present

## 2021-12-05 DIAGNOSIS — E785 Hyperlipidemia, unspecified: Secondary | ICD-10-CM

## 2021-12-05 DIAGNOSIS — I2510 Atherosclerotic heart disease of native coronary artery without angina pectoris: Secondary | ICD-10-CM | POA: Diagnosis not present

## 2021-12-05 DIAGNOSIS — I350 Nonrheumatic aortic (valve) stenosis: Secondary | ICD-10-CM | POA: Diagnosis not present

## 2021-12-05 NOTE — Patient Instructions (Signed)
Medication Instructions:  Your physician recommends that you continue on your current medications as directed. Please refer to the Current Medication list given to you today.  *If you need a refill on your cardiac medications before your next appointment, please call your pharmacy*  Lab Work: NONE  Testing/Procedures: NONE  Follow-Up: At [facility], you and your health needs are our priority.  As part of our continuing mission to provide you with exceptional heart care, we have created designated Provider Care Teams.  These Care Teams include your primary Cardiologist (physician) and Advanced Practice Providers (APPs -  Physician Assistants and Nurse Practitioners) who all work together to provide you with the care you need, when you need it.  Your next appointment:   1 year(s)  The format for your next appointment:   In Person  Provider:   Rudean Haskell, MD  Important Information About Sugar

## 2022-06-17 ENCOUNTER — Encounter: Payer: Self-pay | Admitting: Gastroenterology

## 2022-06-17 ENCOUNTER — Ambulatory Visit: Payer: Medicare HMO | Admitting: Gastroenterology

## 2022-06-17 VITALS — BP 132/80 | HR 63 | Ht 67.5 in | Wt 173.5 lb

## 2022-06-17 DIAGNOSIS — K581 Irritable bowel syndrome with constipation: Secondary | ICD-10-CM | POA: Diagnosis not present

## 2022-06-17 DIAGNOSIS — R1030 Lower abdominal pain, unspecified: Secondary | ICD-10-CM

## 2022-06-17 DIAGNOSIS — Z800 Family history of malignant neoplasm of digestive organs: Secondary | ICD-10-CM

## 2022-06-17 NOTE — Patient Instructions (Signed)
_______________________________________________________  If your blood pressure at your visit was 140/90 or greater, please contact your primary care physician to follow up on this.  _______________________________________________________  If you are age 73 or older, your body mass index should be between 23-30. Your Body mass index is 26.77 kg/m. If this is out of the aforementioned range listed, please consider follow up with your Primary Care Provider.  If you are age 76 or younger, your body mass index should be between 19-25. Your Body mass index is 26.77 kg/m. If this is out of the aformentioned range listed, please consider follow up with your Primary Care Provider.   ________________________________________________________  The [facility] providers would like to encourage you to use [facility] to communicate with providers for non-urgent requests or questions.  Due to long hold times on the telephone, sending your provider a message by [place] may be a faster and more efficient way to get a response.  Please allow 48 business hours for a response.  Please remember that this is for non-urgent requests.  _______________________________________________________  Please fax the lab results to [phone] attn Dr Lyndel Safe  Repeat colonoscopy for 08-2024. Please call 2 months prior to schedule this. A letter will be sent as it gets closer.  Please purchase the following medications over the counter and take as directed: Metamucil 1 tsp daily in 8oz of water Dulcolax 3 times a week  You have been scheduled for an abdominal ultrasound at [facility] (1st floor of hospital) on 06-23-2022 at 8am. Please arrive 15 minutes prior to your appointment for registration. Make certain not to have anything to eat or drink midnight prior to your appointment. Should you need to reschedule your appointment, please contact radiology at [phone]. This test typically takes about 30 minutes to  perform.  Thank you,  Dr. Jackquline Denmark

## 2022-06-17 NOTE — Progress Notes (Signed)
Chief Complaint: FU  Referring Provider:  No ref. provider found      ASSESSMENT AND PLAN;   #1. Lower Abdo pain (resolved). Neg CT  AP 04/2021  #2. IBS-C. Neg colon 08/2019 except for inflammatory polyp, melanosis  #3. FH of Colon Ca (mom 32)  #4. 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 Nl Plt/alb/no cirrhosis (Dr Maceo Pro @ [facility])  Plan: -Metamucil 1 tsp po QD with 8 oz. -Dulcolax MWF (3/week) -[place] abdo (for liver eval) -Recall Colon 08/2024. -Send [place] the blood work done recently.  Hopefully he will have AFP -FU PRN   HPI:    Angel Conner is a 72 y.o. male  With HCV s/p Harvoni with SVR 2015, HTN, HLD, B12 def  Doing great. On gluten free diet (so is his daughter)  Constipation is much better as long as he takes Dulcolax 3/week and Metamucil  No nausea, vomiting, heartburn, regurgitation, odynophagia or dysphagia.  No significant diarrhea.  No melena or hematochezia. No unintentional weight loss. No further abdominal pain.  Underwent CT Abdo/pelvis 05/05/2021 -No evidence of bowel obstruction. Normal appendix. No bowel wall thickening or inflammatory changes. -Prostatomegaly, suggesting BPH. Mildly thick-walled bladder, suggesting chronic bladder outlet obstruction.  He has been closely followed by urology.  Negative colonoscopy 08/18/2019 as below.   Nl CBC, CMP, TSH, celiac, CRP.  Past GI work-up: Colonoscopy 08/18/2019 [facility] 8 mm sigmoid polyp s/p polypectomy (Bx-inflammatory), internal hemorrhoids, moderate melanosis coli in the entire colon. Rpt 5 Yrs d/t family history  CT Abdo/pelvis 05/05/2021 -No evidence of bowel obstruction. Normal appendix. No bowel wall thickening or inflammatory changes. -Prostatomegaly, suggesting BPH. Mildly thick-walled bladder, suggesting chronic bladder outlet obstruction.  H/O HCV (GT1, Gd2/stage1  on liver Bx 05/2007, failed vertex open label study (Tele/pegasys/ribavirin), then treated with Harvoni 08/2013 with SVR. Nl Plt/alb/no cirrhosis (Dr Maceo Pro @ [facility]) Past Medical History:  Diagnosis Date   B12 deficiency    BPH (benign prostatic hyperplasia)    Constipation    Diverticula of colon    Hep C w/o coma, chronic (HCC)    High cholesterol    Hypercholesterolemia    Hyperlipidemia    Hypertension    Lipoma of groin     History reviewed. No pertinent surgical history.  Family History  Problem Relation Age of Onset   Colon cancer Mother 70   Diabetes Father    Rectal cancer Neg Hx    Esophageal cancer Neg Hx    Stomach cancer Neg Hx     Social History   Tobacco Use   Smoking status: Former    Types: Cigarettes   Smokeless tobacco: Never   Tobacco comments:    Stopped smoking 30 years ago - 04/29/2021  Vaping Use   Vaping Use: Never used  Substance Use Topics   Alcohol use: Never   Drug use: Never    Current Outpatient Medications  Medication Sig Dispense Refill   aspirin EC 81 MG tablet Take 1 tablet (81 mg total) by mouth 3 (three) times a week. Swallow whole. Take on Mondays, Wednesdays, and Fridays.     atorvastatin (LIPITOR) 40 MG tablet Take 1 tablet (40 mg total) by mouth at bedtime. 90 tablet 3   cyanocobalamin (,VITAMIN B-12,) 1000 MCG/ML injection Once Monthly     losartan (COZAAR) 50 MG tablet Take 50 mg by mouth daily.     Multiple Vitamin (  MULTIVITAMIN) tablet Take 1 tablet by mouth daily.     tadalafil (CIALIS) 5 MG tablet Take 1 tablet by mouth daily as needed.     No current facility-administered medications for this visit.    No Known Allergies  Review of Systems:  neg     Physical Exam:    BP 132/80   Pulse 63   Ht 5' 7.5" (1.715 m)   Wt 173 lb 8 oz (78.7 kg)   SpO2 99%   BMI 26.77 kg/m  Wt Readings from Last 3 Encounters:  06/17/22 173 lb 8 oz (78.7 kg)  12/05/21 175 lb (79.4 kg)  08/14/21 173 lb 9.6 oz (78.7 kg)    Constitutional:  Well-developed, in no acute distress. Psychiatric: Normal mood and affect. Behavior is normal. HEENT: Pupils normal.  Conjunctivae are normal. No scleral icterus. Cardiovascular: Normal rate, regular rhythm. No edema Pulmonary/chest: Effort normal and breath sounds normal. No wheezing, rales or rhonchi. Abdominal: Soft, nondistended. Nontender. Bowel sounds active throughout. There are no masses palpable. No hepatomegaly. Rectal: Deferred Neurological: Alert and oriented to person place and time. Skin: Skin is warm and dry. No rashes noted.     Carmell Austria, MD 06/17/2022, 11:24 AM  Cc: No ref. provider found

## 2022-06-20 NOTE — Telephone Encounter (Signed)
Thanks for sending the blood tests reports Looks good RG

## 2022-06-23 ENCOUNTER — Ambulatory Visit (HOSPITAL_COMMUNITY)
Admission: RE | Admit: 2022-06-23 | Discharge: 2022-06-23 | Disposition: A | Discharge status: Home / Self Care | Payer: Medicare HMO | Source: Ambulatory Visit | Attending: Gastroenterology | Admitting: Gastroenterology

## 2022-06-23 ENCOUNTER — Encounter: Payer: Self-pay | Admitting: Gastroenterology

## 2022-06-23 DIAGNOSIS — R1030 Lower abdominal pain, unspecified: Secondary | ICD-10-CM | POA: Insufficient documentation

## 2022-06-23 DIAGNOSIS — K581 Irritable bowel syndrome with constipation: Secondary | ICD-10-CM | POA: Diagnosis present

## 2022-06-24 NOTE — Telephone Encounter (Signed)
Ultrasound looked really good Ultrasound is not a test for stomach or any "gas-filled organs"  Endoscopy is the best test.  It is only recommended in case of any problems RG

## 2022-09-03 ENCOUNTER — Telehealth: Payer: Self-pay | Admitting: Pulmonary Disease

## 2022-09-03 NOTE — Telephone Encounter (Signed)
Self Referral Form completed and put in Dr.'s box on 4/17

## 2022-09-05 ENCOUNTER — Encounter: Payer: Self-pay | Admitting: Emergency Medicine

## 2022-09-05 ENCOUNTER — Ambulatory Visit: Payer: Medicare HMO | Admitting: Emergency Medicine

## 2022-09-05 ENCOUNTER — Ambulatory Visit (INDEPENDENT_AMBULATORY_CARE_PROVIDER_SITE_OTHER): Payer: Medicare HMO

## 2022-09-05 ENCOUNTER — Institutional Professional Consult (permissible substitution): Payer: Medicare HMO | Admitting: Emergency Medicine

## 2022-09-05 VITALS — BP 128/76 | HR 79 | Temp 97.9°F | Ht 67.5 in | Wt 171.2 lb

## 2022-09-05 DIAGNOSIS — J4520 Mild intermittent asthma, uncomplicated: Secondary | ICD-10-CM | POA: Insufficient documentation

## 2022-09-05 DIAGNOSIS — R0602 Shortness of breath: Secondary | ICD-10-CM

## 2022-09-05 DIAGNOSIS — R0600 Dyspnea, unspecified: Secondary | ICD-10-CM | POA: Insufficient documentation

## 2022-09-05 LAB — CBC WITH DIFFERENTIAL/PLATELET
Basophils Absolute: 0 10*3/uL (ref 0.0–0.1)
Basophils Relative: 0.5 % (ref 0.0–3.0)
Eosinophils Absolute: 0.2 10*3/uL (ref 0.0–0.7)
Eosinophils Relative: 3.7 % (ref 0.0–5.0)
HCT: 43.6 % (ref 39.0–52.0)
Hemoglobin: 15.1 g/dL (ref 13.0–17.0)
Lymphocytes Relative: 40.5 % (ref 12.0–46.0)
Lymphs Abs: 2 10*3/uL (ref 0.7–4.0)
MCHC: 34.6 g/dL (ref 30.0–36.0)
MCV: 95.6 fl (ref 78.0–100.0)
Monocytes Absolute: 0.7 10*3/uL (ref 0.1–1.0)
Monocytes Relative: 14.7 % — ABNORMAL HIGH (ref 3.0–12.0)
Neutro Abs: 2 10*3/uL (ref 1.4–7.7)
Neutrophils Relative %: 40.6 % — ABNORMAL LOW (ref 43.0–77.0)
Platelets: 207 10*3/uL (ref 150.0–400.0)
RBC: 4.56 Mil/uL (ref 4.22–5.81)
RDW: 12.6 % (ref 11.5–15.5)
WBC: 5 10*3/uL (ref 4.0–10.5)

## 2022-09-05 LAB — BASIC METABOLIC PANEL
BUN: 8 mg/dL (ref 6–23)
CO2: 29 mEq/L (ref 19–32)
Calcium: 9.8 mg/dL (ref 8.4–10.5)
Chloride: 102 mEq/L (ref 96–112)
Creatinine, Ser: 0.92 mg/dL (ref 0.40–1.50)
GFR: 82.7 mL/min (ref >60.00)
Glucose, Bld: 63 mg/dL — ABNORMAL LOW (ref 70–99)
Potassium: 3.9 mEq/L (ref 3.5–5.1)
Sodium: 138 mEq/L (ref 135–145)

## 2022-09-05 LAB — TSH: TSH: 1.25 u[IU]/mL (ref 0.35–5.50)

## 2022-09-05 NOTE — Progress Notes (Signed)
Subjective:    Patient ID: Angel Conner, male    DOB: 04-25-1951, 72 y.o.   MRN: [id number]  HPI 72 year old man, former smoker (10-15 pack years) with a history of hypertension, hyperlipidemia, hep C, BPH constipation. He had asthma in childhood, was not on meds.   He is here for new SOB that he began to notice about 1 month ago. Happens at random when he is sitting still. Feels that he cannot get a deep breath in. Happens more in afternoon and evening. Resolves after a few hours. He is quite active, is still able to do his normal workouts. No fevers. He sometimes has cough in the am, prod of clear mucous. Only seems to happen at home.  Denies any focal weakness, diplopia.  Cardiac scoring CT chest 08/08/2021 reviewed by me showed coronary calcium score 590 (87 percentile).  Numerous calcified right hilar lymph nodes without any nodules or masses.  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, chronic    High cholesterol    Hypercholesterolemia    Hyperlipidemia    Hypertension    Lipoma of groin      Family History  Problem Relation Age of Onset   Colon cancer Mother 2   Diabetes Father    Rectal cancer Neg Hx    Esophageal cancer Neg Hx    Stomach cancer Neg Hx      Social History   Socioeconomic History   Marital status: Single    Spouse name: Not on file   Number of children: 1   Years of education: Not on file   Highest education level: Not on file  Occupational History   Occupation: Retired  Tobacco Use   Smoking status: Former    Packs/day: 1.00    Years: 10.00    Additional pack years: 0.00    Total pack years: 10.00    Types:  Cigarettes   Smokeless tobacco: Never   Tobacco comments:    Stopped smoking 30 years ago - 04/29/2021  Vaping Use   Vaping Use: Never used  Substance and Sexual Activity   Alcohol use: Never   Drug use: Never   Sexual activity: Not on file  Other Topics Concern   Not on file  Social History Narrative   Not on file   Social Determinants of Health   Financial Resource Strain: Not on file  Food Insecurity: Not on file  Transportation Needs: Not on file  Physical Activity: Not on file  Stress: Not on file  Social Connections: Not on file  Intimate Partner Violence: Not on Therapist, nutritional in [place]  No military [place] Has a cat, never birds No mold exposure   No Known Allergies   Outpatient Medications Prior to Visit  Medication Sig Dispense Refill   aspirin EC 81 MG tablet Take 1 tablet (81 mg total) by mouth 3 (three) times a week. Swallow whole. Take on Mondays, Wednesdays, and Fridays.     atorvastatin (LIPITOR) 40 MG tablet Take 1 tablet (40 mg total) by mouth at bedtime. 90 tablet 3   cyanocobalamin (,  VITAMIN B-12,) 1000 MCG/ML injection Once Monthly     losartan (COZAAR) 50 MG tablet Take 50 mg by mouth daily.     Multiple Vitamin (MULTIVITAMIN) tablet Take 1 tablet by mouth daily.     tadalafil (CIALIS) 5 MG tablet Take 1 tablet by mouth daily as needed.     No facility-administered medications prior to visit.         Objective:   Physical Exam  Vitals:   09/05/22 1302  BP: 128/76  Pulse: 79  Temp: 97.9 F (36.6 C)  TempSrc: Oral  SpO2: 98%  Weight: 171 lb 3.2 oz (77.7 kg)  Height: 5' 7.5" (1.715 m)    Gen: Pleasant, well-nourished, in no distress,  normal affect  ENT: No lesions,  mouth clear,  oropharynx clear, no postnasal drip  Neck: No JVD, no stridor  Lungs: No use of accessory muscles, no crackles or wheezing on normal respiration, no wheeze on forced expiration  Cardiovascular: RRR, heart sounds normal, no murmur  or gallops, no peripheral edema  Musculoskeletal: No deformities, no cyanosis or clubbing  Neuro: alert, awake, non focal  Skin: Warm, no lesions or rash     Assessment & Plan:   Dyspnea He maintains good functional capacity, more can work out and exercise without difficulty.  The shortness of breath happens at rest when he is sitting, sounds more restrictive in nature.  He denies any symptoms that would be consistent with neuromuscular disease or weakness.  No upper airway symptoms or evidence for acute laryngeal spasm.  We will arrange for pulmonary function testing, chest x-ray today to begin the evaluation.  Also check CBC, BMP, TSH to ensure no other occult contributors to possible dyspnea.   Levy Pupa, MD, PhD 09/05/2022, 1:34 PM [facility] [phone] or if no answer before 7:00PM call [phone] For any issues after 7:00PM please call eLink [phone]

## 2022-09-05 NOTE — Assessment & Plan Note (Signed)
He maintains good functional capacity, more can work out and exercise without difficulty.  The shortness of breath happens at rest when he is sitting, sounds more restrictive in nature.  He denies any symptoms that would be consistent with neuromuscular disease or weakness.  No upper airway symptoms or evidence for acute laryngeal spasm.  We will arrange for pulmonary function testing, chest x-ray today to begin the evaluation.  Also check CBC, BMP, TSH to ensure no other occult contributors to possible dyspnea.

## 2022-09-05 NOTE — Addendum Note (Signed)
Addended by: Dorisann Frames R on: 09/05/2022 01:37 PM   Modules accepted: Orders

## 2022-09-05 NOTE — Patient Instructions (Signed)
We will arrange for pulmonary function testing We will perform a chest x-ray today We will arrange for lab work today Follow Dr. Delton Coombes next available after your testing so we can review the results together.

## 2022-09-09 ENCOUNTER — Encounter: Payer: Self-pay | Admitting: Emergency Medicine

## 2022-09-18 ENCOUNTER — Institutional Professional Consult (permissible substitution): Payer: Medicare HMO | Admitting: Pulmonary Disease

## 2022-10-06 ENCOUNTER — Emergency Department (HOSPITAL_COMMUNITY)
Admission: EM | Admit: 2022-10-06 | Discharge: 2022-10-06 | Disposition: A | Discharge status: Home / Self Care | Payer: Medicare HMO | Attending: Emergency Medicine | Admitting: Emergency Medicine

## 2022-10-06 ENCOUNTER — Other Ambulatory Visit: Payer: Self-pay

## 2022-10-06 ENCOUNTER — Emergency Department (HOSPITAL_COMMUNITY): Payer: Medicare HMO

## 2022-10-06 ENCOUNTER — Encounter (HOSPITAL_COMMUNITY): Payer: Self-pay

## 2022-10-06 DIAGNOSIS — R0602 Shortness of breath: Secondary | ICD-10-CM | POA: Diagnosis present

## 2022-10-06 DIAGNOSIS — R0600 Dyspnea, unspecified: Secondary | ICD-10-CM | POA: Diagnosis not present

## 2022-10-06 DIAGNOSIS — Z7982 Long term (current) use of aspirin: Secondary | ICD-10-CM | POA: Diagnosis not present

## 2022-10-06 DIAGNOSIS — J45909 Unspecified asthma, uncomplicated: Secondary | ICD-10-CM | POA: Diagnosis not present

## 2022-10-06 LAB — CBC WITH DIFFERENTIAL/PLATELET
Abs Immature Granulocytes: 0.01 10*3/uL (ref 0.00–0.07)
Basophils Absolute: 0 10*3/uL (ref 0.0–0.1)
Basophils Relative: 0 %
Eosinophils Absolute: 0.2 10*3/uL (ref 0.0–0.5)
Eosinophils Relative: 3 %
HCT: 42.7 % (ref 39.0–52.0)
Hemoglobin: 14.9 g/dL (ref 13.0–17.0)
Immature Granulocytes: 0 %
Lymphocytes Relative: 27 %
Lymphs Abs: 1.9 10*3/uL (ref 0.7–4.0)
MCH: 32.3 pg (ref 26.0–34.0)
MCHC: 34.9 g/dL (ref 30.0–36.0)
MCV: 92.6 fL (ref 80.0–100.0)
Monocytes Absolute: 0.9 10*3/uL (ref 0.1–1.0)
Monocytes Relative: 12 %
Neutro Abs: 4 10*3/uL (ref 1.7–7.7)
Neutrophils Relative %: 58 %
Platelets: 195 10*3/uL (ref 150–400)
RBC: 4.61 MIL/uL (ref 4.22–5.81)
RDW: 12.2 % (ref 11.5–15.5)
WBC: 7 10*3/uL (ref 4.0–10.5)
nRBC: 0 % (ref 0.0–0.2)

## 2022-10-06 LAB — BRAIN NATRIURETIC PEPTIDE: B Natriuretic Peptide: 55.6 pg/mL (ref 0.0–100.0)

## 2022-10-06 LAB — TROPONIN I (HIGH SENSITIVITY)
Troponin I (High Sensitivity): 9 ng/L (ref <18)
Troponin I (High Sensitivity): 13 ng/L (ref <18)

## 2022-10-06 LAB — COMPREHENSIVE METABOLIC PANEL
ALT: 28 U/L (ref 0–44)
AST: 37 U/L (ref 15–41)
Albumin: 4 g/dL (ref 3.5–5.0)
Alkaline Phosphatase: 57 U/L (ref 38–126)
Anion gap: 12 (ref 5–15)
BUN: 8 mg/dL (ref 8–23)
CO2: 23 mmol/L (ref 22–32)
Calcium: 9.3 mg/dL (ref 8.9–10.3)
Chloride: 100 mmol/L (ref 98–111)
Creatinine, Ser: 0.9 mg/dL (ref 0.61–1.24)
GFR, Estimated: >60 mL/min (ref >60)
Glucose, Bld: 182 mg/dL — ABNORMAL HIGH (ref 70–99)
Potassium: 3.2 mmol/L — ABNORMAL LOW (ref 3.5–5.1)
Sodium: 135 mmol/L (ref 135–145)
Total Bilirubin: 1.3 mg/dL — ABNORMAL HIGH (ref 0.3–1.2)
Total Protein: 7 g/dL (ref 6.5–8.1)

## 2022-10-06 MED ORDER — ALBUTEROL SULFATE HFA 108 (90 BASE) MCG/ACT IN AERS
1.0000 | INHALATION_SPRAY | Freq: Four times a day (QID) | RESPIRATORY_TRACT | Status: DC | PRN
  Administered 2022-10-06: 2 via RESPIRATORY_TRACT
  Filled 2022-10-06: qty 6.7

## 2022-10-06 MED ORDER — IOHEXOL 350 MG/ML SOLN
75.0000 mL | Freq: Once | INTRAVENOUS | Status: AC | PRN
  Administered 2022-10-06: 75 mL via INTRAVENOUS

## 2022-10-06 MED ORDER — ALBUTEROL SULFATE HFA 108 (90 BASE) MCG/ACT IN AERS: 1.0000 | INHALATION_SPRAY | Freq: Four times a day (QID) | RESPIRATORY_TRACT | 1 refills | Status: DC | PRN

## 2022-10-06 MED ORDER — POTASSIUM CHLORIDE CRYS ER 20 MEQ PO TBCR
40.0000 meq | EXTENDED_RELEASE_TABLET | Freq: Once | ORAL | Status: AC
  Administered 2022-10-06: 40 meq via ORAL
  Filled 2022-10-06: qty 2

## 2022-10-06 MED ORDER — PREDNISONE 10 MG PO TABS: 20.0000 mg | ORAL_TABLET | Freq: Every day | ORAL | 0 refills | Status: DC

## 2022-10-06 NOTE — ED Triage Notes (Signed)
From home. Over the past 6 months has had intermittent cough and shortness of breath. Seen by PCP and has an upcoming appointment with pulmonologist. Tends to only occur when he is at home. Patient went out and had a workout today, did not have any shortness of breath, but started to after getting home. 97-98% on room air, but lung sounds were decreased per EMS report. 2x duoneb per EMS and 1x albuterol. 125mg  solu-medrol given.

## 2022-10-06 NOTE — Discharge Instructions (Addendum)
Return for any problem.  ?

## 2022-10-06 NOTE — ED Provider Notes (Signed)
EMERGENCY DEPARTMENT AT [facility] Provider Note   CSN: [id number] Arrival date & time: 10/06/22  1225     History  Chief Complaint  Patient presents with   Shortness of Breath    Angel Conner is a 72 y.o. male.  72 year old male with prior medical history as detailed below presents for evaluation.  Patient was self-reported 6 months of intermittent shortness of breath and associated cough.  Patient has appointment with pulmonologist upcoming.  Patient reports that today at home after workout he had significant shortness of breath.  This was worse than what he is experienced previously.  He contacted EMS.  EMS reports patient without clear hypoxia.  Patient apparently had wheezes.  Patient was given 2 DuoNebs and 1 albuterol breathing treatment.  Patient was given 125 mg of Solu-Medrol by EMS.  Patient reports significant improvement at time of my evaluation.  He denies current shortness of breath at time of my evaluation.  Patient does have history of asthma as a child.  Patient denies chest pain.  He denies recent fever.  The history is provided by the patient and medical records.       Home Medications Prior to Admission medications   Medication Sig Start Date End Date Taking? Authorizing Provider  aspirin EC 81 MG tablet Take 1 tablet (81 mg total) by mouth 3 (three) times a week. Swallow whole. Take on Mondays, Wednesdays, and Fridays. 11/08/21   Lyn Records, MD  atorvastatin (LIPITOR) 40 MG tablet Take 1 tablet (40 mg total) by mouth at bedtime. 08/14/21   Dyann Kief, PA-C  cyanocobalamin (,VITAMIN B-12,) 1000 MCG/ML injection Once Monthly 06/05/16   [provider]  losartan (COZAAR) 50 MG tablet Take 50 mg by mouth daily. 04/13/21   [provider]  Multiple Vitamin (MULTIVITAMIN) tablet Take 1 tablet by mouth daily.    [provider]  tadalafil (CIALIS) 5 MG tablet Take 1 tablet by mouth daily as needed.     [provider]      Allergies    Patient has no known allergies.    Review of Systems   Review of Systems  All other systems reviewed and are negative.   Physical Exam Updated Vital Signs BP 128/64   Pulse 88   Temp 99.1 F (37.3 C) (Oral)   Resp (!) 23   SpO2 96%  Physical Exam Vitals and nursing note reviewed.  Constitutional:      General: He is not in acute distress.    Appearance: Normal appearance. He is well-developed.  HENT:     Head: Normocephalic and atraumatic.  Eyes:     Conjunctiva/sclera: Conjunctivae normal.     Pupils: Pupils are equal, round, and reactive to light.  Cardiovascular:     Rate and Rhythm: Normal rate and regular rhythm.     Heart sounds: Normal heart sounds.  Pulmonary:     Effort: Pulmonary effort is normal. No respiratory distress.     Breath sounds: Normal breath sounds.  Abdominal:     General: There is no distension.     Palpations: Abdomen is soft.     Tenderness: There is no abdominal tenderness.  Musculoskeletal:        General: No deformity. Normal range of motion.     Cervical back: Normal range of motion and neck supple.  Skin:    General: Skin is warm and dry.  Neurological:     General: No focal deficit present.  Mental Status: He is alert and oriented to person, place, and time.     ED Results / Procedures / Treatments   Labs (all labs ordered are listed, but only abnormal results are displayed) Labs Reviewed  COMPREHENSIVE METABOLIC PANEL - Abnormal; Notable for the following components:      Result Value   Potassium 3.2 (*)    Glucose, Bld 182 (*)    Total Bilirubin 1.3 (*)    All other components within normal limits  CBC WITH DIFFERENTIAL/PLATELET  BRAIN NATRIURETIC PEPTIDE  TROPONIN I (HIGH SENSITIVITY)  TROPONIN I (HIGH SENSITIVITY)    EKG EKG Interpretation  Date/Time:  Monday Oct 06 2022 12:39:53 EDT Ventricular Rate:  100 PR Interval:  156 QRS Duration: 107 QT  Interval:  365 QTC Calculation: 471 R Axis:   100 Text Interpretation: Sinus tachycardia Right axis deviation RSR' in V1 or V2, probably normal variant Confirmed by Kristine Royal [id number]) on 10/06/2022 3:18:41 PM  Radiology CT Angio Chest PE W and/or Wo Contrast  Result Date: 10/06/2022 CLINICAL DATA:  Clinical suspicion for PE, shortness of breath EXAM: CT ANGIOGRAPHY CHEST WITH CONTRAST TECHNIQUE: Multidetector CT imaging of the chest was performed using the standard protocol during bolus administration of intravenous contrast. Multiplanar CT image reconstructions and MIPs were obtained to evaluate the vascular anatomy. RADIATION DOSE REDUCTION: This exam was performed according to the departmental dose-optimization program which includes automated exposure control, adjustment of the mA and/or kV according to patient size and/or use of iterative reconstruction technique. CONTRAST:  75mL OMNIPAQUE IOHEXOL 350 MG/ML SOLN COMPARISON:  Chest radiograph done earlier today FINDINGS: Cardiovascular: There are no intraluminal filling defects in pulmonary artery branches. There is homogeneous enhancement in thoracic aorta. Coronary artery calcifications are seen. Metallic density is seen in aortic annulus. Heart is enlarged in size. Mediastinum/Nodes: No significant lymphadenopathy is seen. Lungs/Pleura: There is no focal pulmonary consolidation. There are no signs alveolar pulmonary edema. There is no pleural effusion or pneumothorax. Upper Abdomen: There is a 1.8 cm low-density lesion in the medial upper pole of left kidney suggesting renal cyst. This finding has not changed in comparison with CT abdomen done on 05/03/2021. Left adrenal is larger than right with no significant interval change. Musculoskeletal: No acute findings are seen. Review of the MIP images confirms the above findings. IMPRESSION: There is no evidence of pulmonary artery embolism. There is no evidence of thoracic aortic dissection. Coronary  artery calcifications are seen. Left renal cyst. Electronically Signed   By: Ernie Avena M.D.   On: 10/06/2022 16:18   DG Chest Portable 1 View  Result Date: 10/06/2022 CLINICAL DATA:  Shortness of breath EXAM: PORTABLE CHEST 1 VIEW COMPARISON:  09/05/2022 FINDINGS: No consolidation, pneumothorax or effusion. Normal cardiopericardial silhouette without edema. Overlapping cardiac leads. Degenerative changes of the spine. Osteopenia. IMPRESSION: No acute cardiopulmonary disease. Electronically Signed   By: Karen Kays M.D.   On: 10/06/2022 13:11    Procedures Procedures    Medications Ordered in ED Medications  albuterol (VENTOLIN HFA) 108 (90 Base) MCG/ACT inhaler 1-2 puff (has no administration in time range)  iohexol (OMNIPAQUE) 350 MG/ML injection 75 mL (75 mLs Intravenous Contrast Given 10/06/22 1605)  potassium chloride SA (KLOR-CON M) CR tablet 40 mEq (40 mEq Oral Given 10/06/22 1647)    ED Course/ Medical Decision Making/ A&P  Medical Decision Making Amount and/or Complexity of Data Reviewed Labs: ordered. Radiology: ordered.  Risk Prescription drug management.    Medical Screen Complete  This patient presented to the ED with complaint of shortness of breath.  This complaint involves an extensive number of treatment options. The initial differential diagnosis includes, but is not limited to, bronchospasm, PE, pneumonia, etc.  This presentation is: Acute, Chronic, Self-Limited, Previously Undiagnosed, Uncertain Prognosis, Complicated, Systemic Symptoms, and Threat to Life/Bodily Function   Patient's presentation is most consistent with likely bronchospasm.  Patient was given breathing treatments and Solu-Medrol prior to my evaluation.  At the time of my evaluation the patient was completely comfortable and without current complaint.  ED workup obtained is without significant acute abnormality detected.  Patient reassured by ED  workup.  Patient feels significantly improved.  He does understand need for close outpatient follow-up.  Strict return precautions given and understood.  Given high likelihood of likely bronchospasm patient discharged home with a short course of steroids and albuterol MDI.     Additional history obtained:  External records from outside sources obtained and reviewed including prior ED visits and prior Inpatient records.    Lab Tests:  I ordered and personally interpreted labs.     Imaging Studies ordered:  I ordered imaging studies including chest x-ray, CTA chest I independently visualized and interpreted obtained imaging which showed NAD I agree with the radiologist interpretation.   Cardiac Monitoring:  The patient was maintained on a cardiac monitor.  I personally viewed and interpreted the cardiac monitor which showed an underlying rhythm of: NSR   Medicines ordered:  I ordered medication including potassium for mild hypokalemia Reevaluation of the patient after these medicines showed that the patient: improved  Problem List / ED Course:  Dyspnea, hypokalemia   Reevaluation:  After the interventions noted above, I reevaluated the patient and found that they have: improved  Disposition:  After consideration of the diagnostic results and the patients response to treatment, I feel that the patent would benefit from close outpatient follow-up.          Final Clinical Impression(s) / ED Diagnoses Final diagnoses:  Dyspnea, unspecified type    Rx / DC Orders ED Discharge Orders          Ordered    predniSONE (DELTASONE) 10 MG tablet  Daily        10/06/22 1659    albuterol (VENTOLIN HFA) 108 (90 Base) MCG/ACT inhaler  Every 6 hours PRN        10/06/22 1659              Wynetta Fines, MD 10/06/22 2332

## 2022-10-06 NOTE — ED Notes (Signed)
Pt refusing high falls risk interventions

## 2022-10-07 ENCOUNTER — Encounter (HOSPITAL_BASED_OUTPATIENT_CLINIC_OR_DEPARTMENT_OTHER): Payer: Self-pay

## 2022-10-08 ENCOUNTER — Encounter (HOSPITAL_BASED_OUTPATIENT_CLINIC_OR_DEPARTMENT_OTHER): Payer: Medicare HMO

## 2022-10-08 ENCOUNTER — Ambulatory Visit: Payer: Medicare HMO | Admitting: Emergency Medicine

## 2022-10-08 ENCOUNTER — Encounter: Payer: Self-pay | Admitting: Emergency Medicine

## 2022-10-08 ENCOUNTER — Encounter (HOSPITAL_BASED_OUTPATIENT_CLINIC_OR_DEPARTMENT_OTHER): Payer: Self-pay

## 2022-10-08 VITALS — BP 136/80 | HR 71 | Temp 98.4°F | Ht 67.0 in | Wt 169.0 lb

## 2022-10-08 DIAGNOSIS — R0602 Shortness of breath: Secondary | ICD-10-CM | POA: Diagnosis not present

## 2022-10-08 NOTE — Progress Notes (Signed)
Subjective:    Patient ID: Angel Conner, male    DOB: 10-Jul-1950, 72 y.o.   MRN: [id number]  HPI 72 year old man, former smoker (10-15 pack years) with a history of hypertension, hyperlipidemia, hep C, BPH constipation. He had asthma in childhood, was not on meds.   He is here for new SOB that he began to notice about 1 month ago. Happens at random when he is sitting still. Feels that he cannot get a deep breath in. Happens more in afternoon and evening. Resolves after a few hours. He is quite active, is still able to do his normal workouts. No fevers. He sometimes has cough in the am, prod of clear mucous. Only seems to happen at home.  Denies any focal weakness, diplopia.  Cardiac scoring CT chest 08/08/2021 reviewed by me showed coronary calcium score 590 (87 percentile).  Numerous calcified right hilar lymph nodes without any nodules or masses.  Exercise tolerance test 09/05/2021 was read as normal  Cardiogram 08/08/2021 showed mild aortic stenosis, normal LV function 60-65%, grade 1 diastolic dysfunction, normal RV size and function   ROV 10/08/22 --follow-up visit 72 year old man, former smoker with history of childhood asthma.  Also with hypertension, hyperlipidemia, hep C, BPH.  I saw him 1 month ago for shortness of breath.  Seems to happen to him while he sitting still and he feels that he cannot get a deep breath in.  He remains quite active, is able to exercise.  We had planned for pulmonary function testing but this has not yet been done.  BMP, TSH, CBC with differential grossly unremarkable with the exception of a monocytosis relative to his neutrophils.  Chest x-ray performed 09/05/2022 showed some mild interstitial prominence without any infiltrates effusions etc.  He had an episode of acute dyspnea 2 days ago that prompted evaluation in the emergency department.  He describes acute dyspnea at rest. He had been to the gym several hour earlier without any problem. He has had some new  cough for 2 weeks, clear thick mucous.   Chest x-ray 10/06/2022 reviewed by me shows no acute abnormality.  CT-PA 10/06/2022 reviewed by me shows no pulmonary embolism, no adenopathy, no consolidation or any evidence for pulmonary edema or other infiltrate.  Note was made of a left renal cyst   Review of Systems As per HPI     Objective:   Physical Exam  Vitals:   10/08/22 1319  BP: 136/80  Pulse: 71  Temp: 98.4 F (36.9 C)  TempSrc: Oral  SpO2: 98%  Weight: 169 lb (76.7 kg)  Height: 5\' 7"  (1.702 m)    Gen: Pleasant, well-nourished, in no distress,  normal affect  ENT: No lesions,  mouth clear,  oropharynx clear, no postnasal drip  Neck: No JVD, no stridor  Lungs: No use of accessory muscles, no crackles or wheezing on normal respiration, focal expiratory wheeze right midlung and base.  Clear on the left  Cardiovascular: RRR, heart sounds normal, no murmur or gallops, no peripheral edema  Musculoskeletal: No deformities, no cyanosis or clubbing  Neuro: alert, awake, non focal  Skin: Warm, no lesions or rash     Assessment & Plan:   Dyspnea Etiology unclear.  Unusual that it happens principally at rest, is not reproduced with exercise (quite active).  Pulmonary function testing is still pending and we will arrange for this.  His CT chest, lab work is reassuring.  Would be reasonable to repeat his echocardiogram to compare with last year.  Plan to follow-up after his testing is completed.   Levy Pupa, MD, PhD 10/08/2022, 1:42 PM [facility] [phone] or if no answer before 7:00PM call [phone] For any issues after 7:00PM please call eLink [phone]

## 2022-10-08 NOTE — Patient Instructions (Addendum)
We will get your pulmonary function testing rescheduled. We will order a repeat echocardiogram We reviewed your lab work, chest x-rays, CT scan of the chest.  These are all reassuring.  Good news Depending on your pulmonary function testing we will decide whether to repeat a cardiopulmonary exercise test. Follow Dr. Delton Coombes next available after your testing so we can review these results together.

## 2022-10-08 NOTE — Assessment & Plan Note (Signed)
Etiology unclear.  Unusual that it happens principally at rest, is not reproduced with exercise (quite active).  Pulmonary function testing is still pending and we will arrange for this.  His CT chest, lab work is reassuring.  Would be reasonable to repeat his echocardiogram to compare with last year.  Plan to follow-up after his testing is completed.

## 2022-11-26 ENCOUNTER — Encounter (HOSPITAL_BASED_OUTPATIENT_CLINIC_OR_DEPARTMENT_OTHER): Payer: Self-pay

## 2022-11-26 ENCOUNTER — Encounter (HOSPITAL_BASED_OUTPATIENT_CLINIC_OR_DEPARTMENT_OTHER): Payer: Medicare HMO

## 2022-12-04 ENCOUNTER — Encounter: Payer: Self-pay | Admitting: Emergency Medicine

## 2022-12-04 ENCOUNTER — Ambulatory Visit: Payer: Medicare HMO | Admitting: Emergency Medicine

## 2022-12-04 ENCOUNTER — Ambulatory Visit (HOSPITAL_COMMUNITY)
Admission: RE | Admit: 2022-12-04 | Discharge: 2022-12-04 | Disposition: A | Discharge status: Home / Self Care | Payer: Medicare HMO | Source: Ambulatory Visit | Attending: Emergency Medicine | Admitting: Emergency Medicine

## 2022-12-04 ENCOUNTER — Other Ambulatory Visit: Payer: Self-pay

## 2022-12-04 VITALS — BP 126/72 | HR 74 | Temp 98.0°F | Ht 68.0 in | Wt 170.4 lb

## 2022-12-04 DIAGNOSIS — J4520 Mild intermittent asthma, uncomplicated: Secondary | ICD-10-CM

## 2022-12-04 DIAGNOSIS — R0602 Shortness of breath: Secondary | ICD-10-CM | POA: Diagnosis not present

## 2022-12-04 LAB — PULMONARY FUNCTION TEST
DL/VA % pred: 148 %
DL/VA: 5.99 ml/min/mmHg/L
DLCO unc % pred: 98 %
DLCO unc: 23.61 ml/min/mmHg
FEF 25-75 Post: 1.59 L/sec
FEF 25-75 Pre: 1.47 L/sec
FEF2575-%Change-Post: 7 %
FEF2575-%Pred-Post: 74 %
FEF2575-%Pred-Pre: 68 %
FEV1-%Change-Post: 2 %
FEV1-%Pred-Post: 68 %
FEV1-%Pred-Pre: 67 %
FEV1-Post: 1.99 L
FEV1-Pre: 1.94 L
FEV1FVC-%Change-Post: 3 %
FEV1FVC-%Pred-Pre: 102 %
FEV6-%Change-Post: -1 %
FEV6-%Pred-Post: 68 %
FEV6-%Pred-Pre: 69 %
FEV6-Post: 2.55 L
FEV6-Pre: 2.58 L
FEV6FVC-%Change-Post: 0 %
FEV6FVC-%Pred-Post: 106 %
FEV6FVC-%Pred-Pre: 106 %
FVC-%Change-Post: -1 %
FVC-%Pred-Post: 64 %
FVC-%Pred-Pre: 65 %
FVC-Post: 2.55 L
FVC-Pre: 2.59 L
Post FEV1/FVC ratio: 78 %
Post FEV6/FVC ratio: 100 %
Pre FEV1/FVC ratio: 75 %
Pre FEV6/FVC Ratio: 100 %
RV % pred: 93 %
RV: 2.24 L
TLC % pred: 73 %
TLC: 4.9 L

## 2022-12-04 MED ORDER — ALBUTEROL SULFATE (2.5 MG/3ML) 0.083% IN NEBU
2.5000 mg | INHALATION_SOLUTION | Freq: Once | RESPIRATORY_TRACT | Status: AC
  Administered 2022-12-04: 2.5 mg via RESPIRATORY_TRACT

## 2022-12-04 NOTE — Assessment & Plan Note (Signed)
Pulmonary function testing today shows mixed obstruction and restriction, suspect that he has a component of fixed asthma from his childhood disease, possibly some impact of his minimal tobacco history.  He is breathing well at this time, realized that he was having dyspnea associated with cat dander exposure.  No longer exposed.  Back to baseline.  I have asked him to keep an albuterol inhaler available.  We will hold off on ICS or any other scheduled medications for now.  Did discuss with him the signs and symptoms of flare and encouraged him to call us if these occur.  I will follow with him annually.

## 2022-12-04 NOTE — Patient Instructions (Signed)
We reviewed your pulmonary function testing today.  There is evidence for abnormal airflow consistent with someone who has had asthma.  You remain at risk for asthma symptoms if you are exposed to triggers. Agree with avoiding cat dander Please keep albuterol available to use 2 puffs if you needed for shortness of breath, chest tightness, wheezing. Continue your good exercise and conditioning routine. Follow Dr. Delton Coombes in 1 year or sooner if you have problems.

## 2022-12-04 NOTE — Progress Notes (Signed)
Subjective:    Patient ID: Angel Conner, male    DOB: 12-18-1950, 72 y.o.   MRN: [id number]  HPI  ROV 10/08/22 --follow-up visit 72 year old man, former smoker with history of childhood asthma.  Also with hypertension, hyperlipidemia, hep C, BPH.  I saw him 1 month ago for shortness of breath.  Seems to happen to him while he sitting still and he feels that he cannot get a deep breath in.  He remains quite active, is able to exercise.  We had planned for pulmonary function testing but this has not yet been done.  BMP, TSH, CBC with differential grossly unremarkable with the exception of a monocytosis relative to his neutrophils.  Chest x-ray performed 09/05/2022 showed some mild interstitial prominence without any infiltrates effusions etc.  He had an episode of acute dyspnea 2 days ago that prompted evaluation in the emergency department.  He describes acute dyspnea at rest. He had been to the gym several hour earlier without any problem. He has had some new cough for 2 weeks, clear thick mucous.   Chest x-ray 10/06/2022 reviewed by me shows no acute abnormality.  CT-PA 10/06/2022 reviewed by me shows no pulmonary embolism, no adenopathy, no consolidation or any evidence for pulmonary edema or other infiltrate.  Note was made of a left renal cyst   ROV 12/04/22 --Mr. Angel Conner is 24, former smoker with history of childhood asthma, hypertension, hyperlipidemia, hep C, BPH.  He has been experiencing dyspnea, often happens at rest.  He is otherwise quite active.  He has a reassuring CT scan of the chest.  We arrange for pulmonary function testing as below.  He has been much better since he gave up his cat, no longer exposed. Very active. Very rare albuterol use. No other triggers  Pulmonary function testing performed today and reviewed by me.  Spirometry consistent with mixed obstruction and restriction without a bronchodilator response.  Lung volumes confirm restriction based on a decreased TLC.  He was  unable to perform DLCO adequately.   Review of Systems As per HPI     Objective:   Physical Exam  Vitals:   12/04/22 1320  BP: 126/72  Pulse: 74  Temp: 98 F (36.7 C)  TempSrc: Oral  SpO2: 99%  Weight: 170 lb 6.4 oz (77.3 kg)  Height: 5\' 8"  (1.727 m)     Gen: Pleasant, well-nourished, in no distress,  normal affect  ENT: No lesions,  mouth clear,  oropharynx clear, no postnasal drip  Neck: No JVD, no stridor  Lungs: No use of accessory muscles, no crackles or wheezing on normal respiration, clear B  Cardiovascular: RRR, heart sounds normal, no murmur or gallops, no peripheral edema  Musculoskeletal: No deformities, no cyanosis or clubbing  Neuro: alert, awake, non focal  Skin: Warm, no lesions or rash     Assessment & Plan:   Intermittent asthma Pulmonary function testing today shows mixed obstruction and restriction, suspect that he has a component of fixed asthma from his childhood disease, possibly some impact of his minimal tobacco history.  He is breathing well at this time, realized that he was having dyspnea associated with cat dander exposure.  No longer exposed.  Back to baseline.  I have asked him to keep an albuterol inhaler available.  We will hold off on ICS or any other scheduled medications for now.  Did discuss with him the signs and symptoms of flare and encouraged him to call us if these occur.  I will  follow with him annually.    Levy Pupa, MD, PhD 12/04/2022, 2:02 PM [facility] [phone] or if no answer before 7:00PM call [phone] For any issues after 7:00PM please call eLink [phone]

## 2022-12-16 ENCOUNTER — Encounter: Payer: Self-pay | Admitting: Internal Medicine

## 2022-12-16 ENCOUNTER — Ambulatory Visit: Payer: Medicare HMO | Attending: Internal Medicine | Admitting: Internal Medicine

## 2022-12-16 VITALS — BP 128/68 | HR 60 | Ht 68.0 in | Wt 172.0 lb

## 2022-12-16 DIAGNOSIS — I2584 Coronary atherosclerosis due to calcified coronary lesion: Secondary | ICD-10-CM

## 2022-12-16 DIAGNOSIS — I350 Nonrheumatic aortic (valve) stenosis: Secondary | ICD-10-CM | POA: Insufficient documentation

## 2022-12-16 DIAGNOSIS — I2510 Atherosclerotic heart disease of native coronary artery without angina pectoris: Secondary | ICD-10-CM | POA: Diagnosis not present

## 2022-12-16 DIAGNOSIS — E785 Hyperlipidemia, unspecified: Secondary | ICD-10-CM

## 2022-12-16 DIAGNOSIS — I10 Essential (primary) hypertension: Secondary | ICD-10-CM | POA: Diagnosis not present

## 2022-12-16 MED ORDER — ATORVASTATIN CALCIUM 20 MG PO TABS: 20.0000 mg | ORAL_TABLET | Freq: Every day | ORAL | 3 refills | Status: DC

## 2022-12-16 NOTE — Progress Notes (Signed)
Cardiology Office Note:    Date:  12/16/2022   ID:  Angel Conner, DOB 06/30/1950, MRN [id number]  PCP:  Georgann Housekeeper, MD  Cardiologist:  Christell Constant, MD   Referring MD: Georgann Housekeeper, MD   CC: Transition to new cardiolgoist   History of Present Illness:    Angel Conner is a 72 y.o. male with a hx of hyperlipidemia, primary hypertension, hepatitis C, prior smoker, CAD (CAC score > 500), and mild aortic stenosis. Last seen by Dr. Katrinka Blazing in 2023.  Patient notes that he is doing great.   Since last visit notes that he goes to the gym 5-6 days a week. He is retired. Works to maintain a health diet- low sodium  health diet.  Had sudden onset shortness of breath- found to have cat allergy.  Has since resolved.  No chest pain or pressure .  No SOB/DOE and no PND/Orthopnea.  No weight gain or leg swelling.  No palpitations or syncope. His LDL has been controlled and he would like to decrease to atorvastatin 20 mg   Past Medical History:  Diagnosis Date   B12 deficiency    BPH (benign prostatic hyperplasia)    Constipation    Diverticula of colon    Hep C w/o coma, chronic (HCC)    High cholesterol    Hypercholesterolemia    Hyperlipidemia    Hypertension    Lipoma of groin     No past surgical history on file.  Current Medications: Current Meds  Medication Sig   albuterol (VENTOLIN HFA) 108 (90 Base) MCG/ACT inhaler Inhale 1-2 puffs into the lungs every 6 (six) hours as needed for wheezing or shortness of breath.   aspirin EC 81 MG tablet Take 1 tablet (81 mg total) by mouth 3 (three) times a week. Swallow whole. Take on Mondays, Wednesdays, and Fridays.   atorvastatin (LIPITOR) 20 MG tablet Take 1 tablet (20 mg total) by mouth daily.   cyanocobalamin (,VITAMIN B-12,) 1000 MCG/ML injection Once Monthly   losartan (COZAAR) 50 MG tablet Take 50 mg by mouth daily.   Multiple Vitamin (MULTIVITAMIN) tablet Take 1 tablet by mouth daily.   tadalafil (CIALIS) 5 MG  tablet Take 1 tablet by mouth daily as needed.   [DISCONTINUED] atorvastatin (LIPITOR) 40 MG tablet Take 1 tablet (40 mg total) by mouth at bedtime.     Allergies:   Patient has no known allergies.   Social History   Socioeconomic History   Marital status: Single    Spouse name: Not on file   Number of children: 1   Years of education: Not on file   Highest education level: Not on file  Occupational History   Occupation: Retired  Tobacco Use   Smoking status: Former    Current packs/day: 1.00    Average packs/day: 1 pack/day for 10.0 years (10.0 ttl pk-yrs)    Types: Cigarettes   Smokeless tobacco: Never   Tobacco comments:    Stopped smoking 30 years ago - 04/29/2021  Vaping Use   Vaping status: Never Used  Substance and Sexual Activity   Alcohol use: Never   Drug use: Never   Sexual activity: Not on file  Other Topics Concern   Not on file  Social History Narrative   Not on file   Social Determinants of Health   Financial Resource Strain: Not on file  Food Insecurity: Not on file  Transportation Needs: Not on file  Physical Activity: Not on file  Stress: Not  on file  Social Connections: Not on file    Family History: The patient's family history includes Colon cancer (age of onset: 93) in his mother; Diabetes in his father. There is no history of Rectal cancer, Esophageal cancer, or Stomach cancer.  ROS:   Please see the history of present illness.     EKGs/Labs/Other Studies Reviewed:    The following studies were reviewed today:  Cardiac Studies & Procedures     STRESS TESTS  EXERCISE TOLERANCE TEST (ETT) 09/05/2021  Narrative   A Bruce protocol stress test was performed. Exercise capacity was excellent. Patient exercised for 10 min and 0 sec. Maximum HR of 129 bpm. MPHR 87.0 %. Peak METS 11.0 . The patient experienced no angina during the test. The patient achieved the target heart rate. The patient requested the test to be stopped. The patient  reported no symptoms during the stress test. Normal blood pressure and normal heart rate response noted during stress. Heart rate recovery was normal.   Nonspecific ST depressions noted at peak stress, does not meet criteria for ischemia (1 mm). Otherwise, upsloping ST depressions noted during stress.   Prior study not available for comparison.   Study is low risk, stress ECG negative for ischemia.   ECHOCARDIOGRAM  ECHOCARDIOGRAM COMPLETE 08/08/2021  Narrative ECHOCARDIOGRAM REPORT    Patient Name:   Angel Conner Date of Exam: 08/08/2021 Medical Rec #:  [id number]     Height:       68.0 in Accession #:    [id number]    Weight:       177.2 lb Date of Birth:  1950/08/11      BSA:          1.941 m Patient Age:    71 years      BP:           124/80 mmHg Patient Gender: M             HR:           76 bpm. Exam Location:  [address]  Procedure: 2D Echo, Cardiac Doppler and Color Doppler  Indications:    R01.1 Murmur  History:        Patient has no prior history of Echocardiogram examinations. Risk Factors:Hypertension, Dyslipidemia and Former Smoker.  Sonographer:    Cathie Beams RCS Referring Phys: [phone] Barry Dienes [facility]  IMPRESSIONS   1. Left ventricular ejection fraction, by estimation, is 60 to 65%. The left ventricle has normal function. The left ventricle has no regional wall motion abnormalities. There is mild concentric left ventricular hypertrophy. Left ventricular diastolic parameters are consistent with Grade I diastolic dysfunction (impaired relaxation). 2. Right ventricular systolic function is normal. The right ventricular size is normal. Tricuspid regurgitation signal is inadequate for assessing PA pressure. 3. The mitral valve is normal in structure. No evidence of mitral valve regurgitation. 4. The right and noncoronary aortic valve cusps have severely restricted motion, but the left coronary cusp moves freely. The aortic valve is tricuspid. There is moderate  calcification of the aortic valve. There is moderate thickening of the aortic valve. Aortic valve regurgitation is not visualized. Mild aortic valve stenosis.  FINDINGS Left Ventricle: Left ventricular ejection fraction, by estimation, is 60 to 65%. The left ventricle has normal function. The left ventricle has no regional wall motion abnormalities. The left ventricular internal cavity size was normal in size. There is mild concentric left ventricular hypertrophy. Left ventricular diastolic parameters are consistent with Grade I diastolic  dysfunction (impaired relaxation). Normal left ventricular filling pressure.  Right Ventricle: The right ventricular size is normal. No increase in right ventricular wall thickness. Right ventricular systolic function is normal. Tricuspid regurgitation signal is inadequate for assessing PA pressure.  Left Atrium: Left atrial size was normal in size.  Right Atrium: Right atrial size was normal in size.  Pericardium: There is no evidence of pericardial effusion.  Mitral Valve: The mitral valve is normal in structure. No evidence of mitral valve regurgitation.  Tricuspid Valve: The tricuspid valve is normal in structure. Tricuspid valve regurgitation is not demonstrated.  Aortic Valve: The right and noncoronary aortic valve cusps have severely restricted motion, but the left coronary cusp moves freely. The aortic valve is tricuspid. There is moderate calcification of the aortic valve. There is moderate thickening of the aortic valve. Aortic valve regurgitation is not visualized. Mild aortic stenosis is present. Aortic valve mean gradient measures 8.0 mmHg. Aortic valve peak gradient measures 15.5 mmHg. Aortic valve area, by VTI measures 1.75 cm.  Pulmonic Valve: The pulmonic valve was grossly normal. Pulmonic valve regurgitation is trivial.  Aorta: The aortic root and ascending aorta are structurally normal, with no evidence of dilitation.  IAS/Shunts: No  atrial level shunt detected by color flow Doppler.   LEFT VENTRICLE PLAX 2D LVIDd:         3.60 cm   Diastology LVIDs:         2.00 cm   LV e' medial:    8.92 cm/s LV PW:         1.40 cm   LV E/e' medial:  8.6 LV IVS:        1.20 cm   LV e' lateral:   8.59 cm/s LVOT diam:     2.00 cm   LV E/e' lateral: 8.9 LV SV:         68 LV SV Index:   35 LVOT Area:     3.14 cm   RIGHT VENTRICLE RV Basal diam:  2.80 cm RV S prime:     14.90 cm/s TAPSE (M-mode): 2.2 cm  LEFT ATRIUM             Index        RIGHT ATRIUM           Index LA diam:        3.40 cm 1.75 cm/m   RA Area:     14.50 cm LA Vol (A2C):   51.4 ml 26.48 ml/m  RA Volume:   30.40 ml  15.66 ml/m LA Vol (A4C):   36.2 ml 18.65 ml/m LA Biplane Vol: 44.2 ml 22.77 ml/m AORTIC VALVE AV Area (Vmax):    1.67 cm AV Area (Vmean):   1.59 cm AV Area (VTI):     1.75 cm AV Vmax:           197.00 cm/s AV Vmean:          129.000 cm/s AV VTI:            0.386 m AV Peak Grad:      15.5 mmHg AV Mean Grad:      8.0 mmHg LVOT Vmax:         105.00 cm/s LVOT Vmean:        65.200 cm/s LVOT VTI:          0.215 m LVOT/AV VTI ratio: 0.56  AORTA Ao Root diam: 2.90 cm Ao Asc diam:  3.10 cm  MITRAL VALVE MV Area (PHT):  6.37 cm    SHUNTS MV Decel Time: 119 msec    Systemic VTI:  0.22 m MV E velocity: 76.50 cm/s  Systemic Diam: 2.00 cm MV A velocity: 82.80 cm/s MV E/A ratio:  0.92  Mihai Croitoru MD Electronically signed by Thurmon Fair MD Signature Date/Time: 08/08/2021/3:37:07 PM    Final     CT SCANS  CT CARDIAC SCORING (SELF PAY ONLY) 08/08/2021  Addendum 08/08/2021  1:14 PM ADDENDUM REPORT: 08/08/2021 13:12  CLINICAL DATA:  Cardiovascular Disease Risk stratification  EXAM: Coronary Calcium Score  TECHNIQUE: A gated, non-contrast computed tomography scan of the heart was performed using 3mm slice thickness. Axial images were analyzed on a dedicated workstation. Calcium scoring of the coronary arteries  was performed using the Agatston method.  FINDINGS: Coronary Calcium Score:  Left main: 0  Left anterior descending artery: 234  Left circumflex artery: 173  Right coronary artery: 183  Total: 590  Percentile: 87  Pericardium: Normal.  Ascending Aorta: Normal caliber.  Non-cardiac: See separate report from [facility].  IMPRESSION: Coronary calcium score of 590. This was 65 percentile for age-, race-, and sex-matched controls.  RECOMMENDATIONS: Coronary artery calcium (CAC) score is a strong predictor of incident coronary heart disease (CHD) and provides predictive information beyond traditional risk factors. CAC scoring is reasonable to use in the decision to withhold, postpone, or initiate statin therapy in intermediate-risk or selected borderline-risk asymptomatic adults (age 66-75 years and LDL-C >=70 to <190 mg/dL) who do not have diabetes or established atherosclerotic cardiovascular disease (ASCVD).* In intermediate-risk (10-year ASCVD risk >=7.5% to <20%) adults or selected borderline-risk (10-year ASCVD risk >=5% to <7.5%) adults in whom a CAC score is measured for the purpose of making a treatment decision the following recommendations have been made:  If CAC=0, it is reasonable to withhold statin therapy and reassess in 5 to 10 years, as long as higher risk conditions are absent (diabetes mellitus, family history of premature CHD in first degree relatives (males <55 years; females <65 years), cigarette smoking, or LDL >=190 mg/dL).  If CAC is 1 to 99, it is reasonable to initiate statin therapy for patients >=39 years of age.  If CAC is >=100 or >=75th percentile, it is reasonable to initiate statin therapy at any age.  Cardiology referral should be considered for patients with CAC scores >=400 or >=75th percentile.  *2018 AHA/ACC/AACVPR/AAPA/ABC/ACPM/ADA/AGS/APhA/ASPC/NLA/PCNA Guideline on the Management of Blood Cholesterol: A Report of  the American College of Cardiology/American Heart Association Task Force on Clinical Practice Guidelines. J Am Coll Cardiol. 2019;73(24):3168-3209.  Olga Millers, MD   Electronically Signed By: Olga Millers M.D. On: 08/08/2021 13:12  Narrative EXAM: OVER-READ INTERPRETATION  CT CHEST  The following report is an over-read performed by radiologist Dr. Trudie Reed of [facility] on 08/08/2021. This over-read does not include interpretation of cardiac or coronary anatomy or pathology. The coronary calcium score interpretation by the cardiologist is attached.  COMPARISON:  None.  FINDINGS: Numerous calcified right hilar lymph nodes are incidentally noted. Within the visualized portions of the thorax there are no suspicious appearing pulmonary nodules or masses, there is no acute consolidative airspace disease, no pleural effusions, no pneumothorax and no lymphadenopathy. Visualized portions of the upper abdomen are unremarkable. There are no aggressive appearing lytic or blastic lesions noted in the visualized portions of the skeleton.  IMPRESSION: No significant incidental noncardiac findings are noted.  Electronically Signed: By: Trudie Reed M.D. On: 08/08/2021 09:18  Recent Labs: 09/05/2022: TSH 1.25 10/06/2022: ALT 28; B Natriuretic Peptide 55.6; BUN 8; Creatinine, Ser 0.90; Hemoglobin 14.9; Platelets 195; Potassium 3.2; Sodium 135  Recent Lipid Panel    Component Value Date/Time   CHOL 125 11/13/2021 0834   TRIG 58 11/13/2021 0834   HDL 49 11/13/2021 0834   CHOLHDL 2.6 11/13/2021 0834   LDLCALC 63 11/13/2021 0834    Physical Exam:    VS:  BP 128/68   Pulse 60   Ht 5\' 8"  (1.727 m)   Wt 172 lb (78 kg)   SpO2 97%   BMI 26.15 kg/m     Wt Readings from Last 3 Encounters:  12/16/22 172 lb (78 kg)  12/04/22 170 lb 6.4 oz (77.3 kg)  10/08/22 169 lb (76.7 kg)     GEN: . No acute distress HEENT: Normal NECK: No  JVD. CARDIAC: 2/6 right upper sternal systolic murmur. RRR VASCULAR:  Normal Pulses.  RESPIRATORY:  Clear to auscultation without rales, wheezing or rhonchi  ABDOMEN: Soft, non-tender, non-distend MUSCULOSKELETAL: No deformity  SKIN: Warm and dry NEUROLOGIC:  Alert and oriented x 3 PSYCHIATRIC:  Normal affect   ASSESSMENT:    1. Mild aortic stenosis   2. Hyperlipidemia, unspecified hyperlipidemia type   3. Coronary artery calcification   4. Essential hypertension     PLAN:    Mild AS - elevated CAC score - Echo in 2026  CAC HLD - agree with ASA - he would like to decrease to atorvastatin 20 mg; will get Lipids and ALT in three months - reviewed his 2023 CT with him.   HTN - controlled on current therapy  One year f/u   Medication Adjustments/Labs and Tests Ordered: Current medicines are reviewed at length with the patient today.  Concerns regarding medicines are outlined above.  Orders Placed This Encounter  Procedures   Lipid panel   ALT   Meds ordered this encounter  Medications   atorvastatin (LIPITOR) 20 MG tablet    Sig: Take 1 tablet (20 mg total) by mouth daily.    Dispense:  90 tablet    Refill:  3    Patient Instructions  Medication Instructions:  Your physician has recommended you make the following change in your medication:  DECREASE: atorvastatin (Lipitor) to 20 mg by mouth once daily  *If you need a refill on your cardiac medications before your next appointment, please call your pharmacy*   Lab Work: 3 MONTHS: Fasting lipid panel and ALT (nothing to eat or drink 8 hours prior except water)  If you have labs (blood work) drawn today and your tests are completely normal, you will receive your results only by: MyChart Message (if you have MyChart) OR A paper copy in the mail If you have any lab test that is abnormal or we need to change your treatment, we will call you to review the results.   Testing/Procedures: NONE   Follow-Up: At  [facility], you and your health needs are our priority.  As part of our continuing mission to provide you with exceptional heart care, we have created designated Provider Care Teams.  These Care Teams include your primary Cardiologist (physician) and Advanced Practice Providers (APPs -  Physician Assistants and Nurse Practitioners) who all work together to provide you with the care you need, when you need it.     Your next appointment:   1 year  Provider:   Christell Constant, MD       Signed, [facility]  Richardean Chimera, MD  12/16/2022 9:39 AM    [facility]

## 2022-12-16 NOTE — Patient Instructions (Signed)
Medication Instructions:  Your physician has recommended you make the following change in your medication:  DECREASE: atorvastatin (Lipitor) to 20 mg by mouth once daily  *If you need a refill on your cardiac medications before your next appointment, please call your pharmacy*   Lab Work: 3 MONTHS: Fasting lipid panel and ALT (nothing to eat or drink 8 hours prior except water)  If you have labs (blood work) drawn today and your tests are completely normal, you will receive your results only by: MyChart Message (if you have MyChart) OR A paper copy in the mail If you have any lab test that is abnormal or we need to change your treatment, we will call you to review the results.   Testing/Procedures: NONE   Follow-Up: At [facility], you and your health needs are our priority.  As part of our continuing mission to provide you with exceptional heart care, we have created designated Provider Care Teams.  These Care Teams include your primary Cardiologist (physician) and Advanced Practice Providers (APPs -  Physician Assistants and Nurse Practitioners) who all work together to provide you with the care you need, when you need it.     Your next appointment:   1 year  Provider:   Christell Constant, MD

## 2023-01-05 ENCOUNTER — Ambulatory Visit: Payer: Medicare HMO | Admitting: Physician Assistant

## 2023-03-11 ENCOUNTER — Ambulatory Visit: Payer: Medicare HMO | Attending: Internal Medicine

## 2023-03-11 DIAGNOSIS — E785 Hyperlipidemia, unspecified: Secondary | ICD-10-CM

## 2023-03-12 LAB — LIPID PANEL
Chol/HDL Ratio: 2.2 ratio (ref 0.0–5.0)
Cholesterol, Total: 119 mg/dL (ref 100–199)
HDL: 53 mg/dL (ref >39)
LDL Chol Calc (NIH): 54 mg/dL (ref 0–99)
Triglycerides: 51 mg/dL (ref 0–149)
VLDL Cholesterol Cal: 12 mg/dL (ref 5–40)

## 2023-03-12 LAB — ALT: ALT: 24 [IU]/L (ref 0–44)

## 2023-06-08 ENCOUNTER — Other Ambulatory Visit: Payer: Self-pay

## 2023-06-08 MED ORDER — ATORVASTATIN CALCIUM 20 MG PO TABS: 20.0000 mg | ORAL_TABLET | Freq: Every day | ORAL | 1 refills | Status: AC

## 2023-07-02 IMAGING — CT CT ABD-PELV W/ CM
2 of 5 series · 16 of 46 positions shown, 18 images · IV contrast (Omnipaque)
Comparison: None.

CLINICAL DATA: Abdominal pain, IBS

EXAM:
CT ABDOMEN AND PELVIS WITH CONTRAST
TECHNIQUE: Multidetector CT imaging of the abdomen and pelvis was performed
using the standard protocol following bolus administration of
intravenous contrast.
CONTRAST:  85mL OMNIPAQUE IOHEXOL 300 MG/ML  SOLN

[Series 2: axial st · axial · 0.85mm/px · z∈[-392,-32]mm · 13 of 82 slices shown, 15 images]
[im 5/82  soft-tissue]
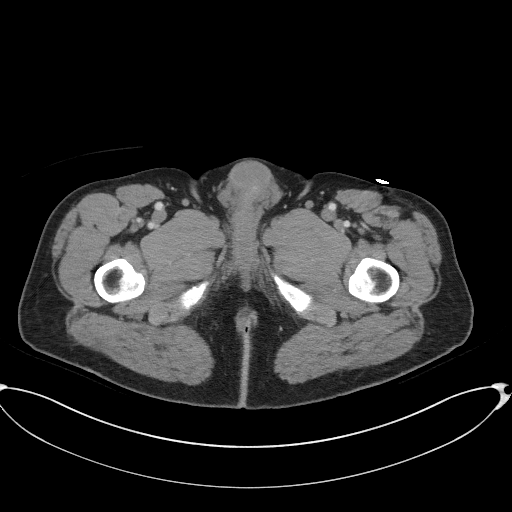
[im 5/82  bone]
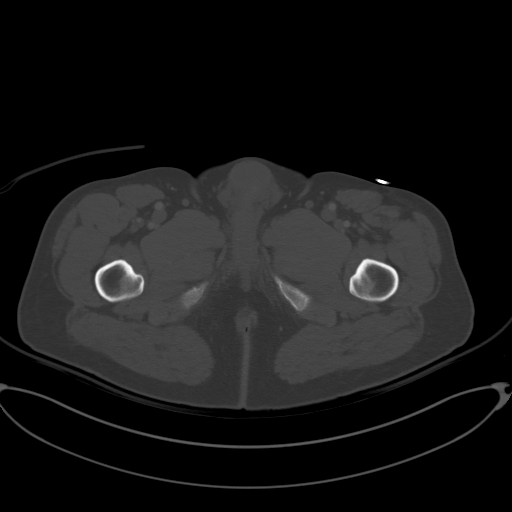
[im 10/82  soft-tissue]
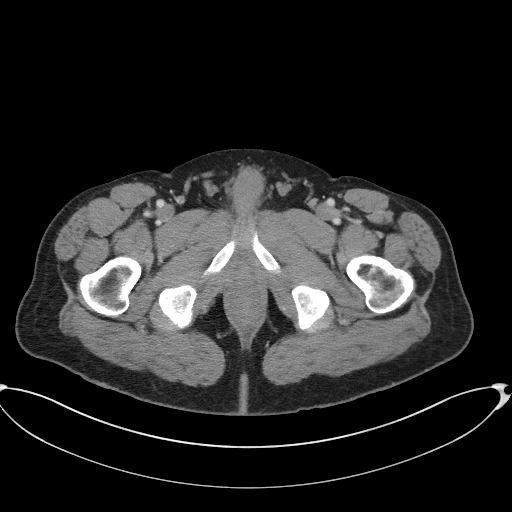
[im 20/82  soft-tissue]
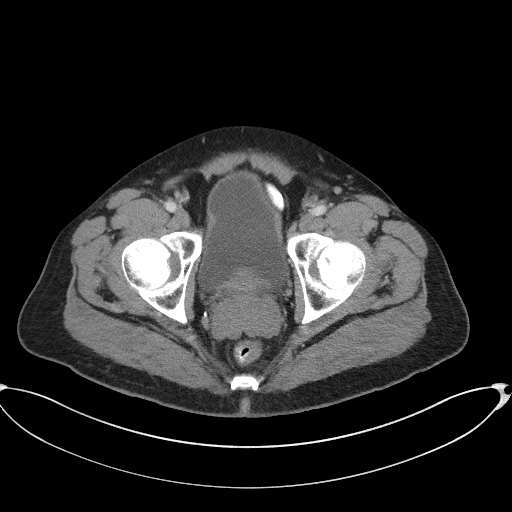
[im 24/82  soft-tissue]
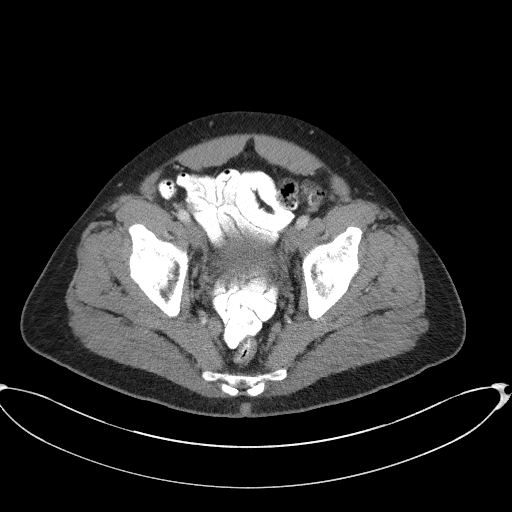
[im 29/82  soft-tissue]
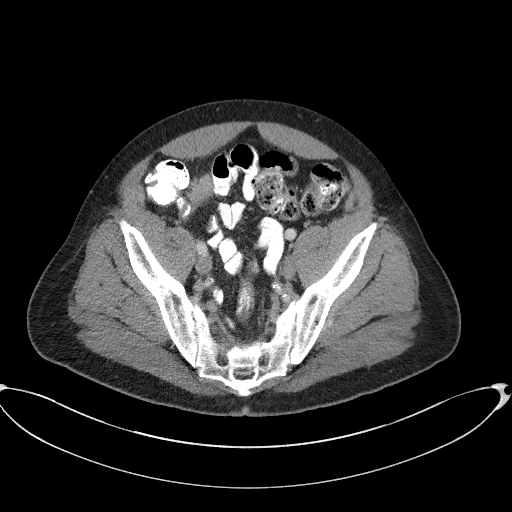
[im 34/82  soft-tissue]
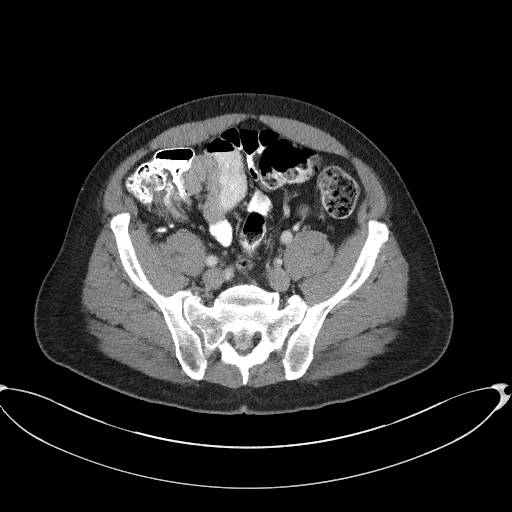
[im 43/82  soft-tissue]
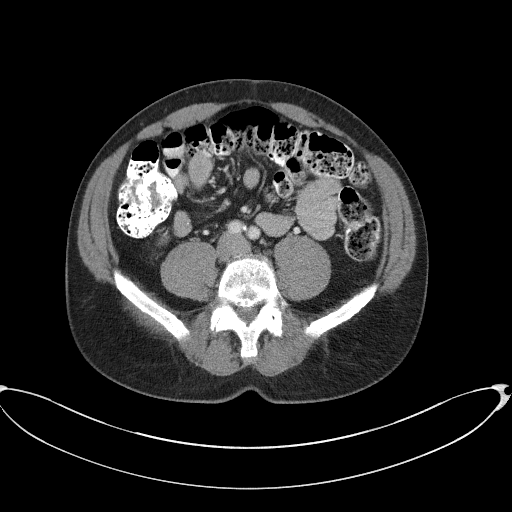
[im 48/82  soft-tissue]
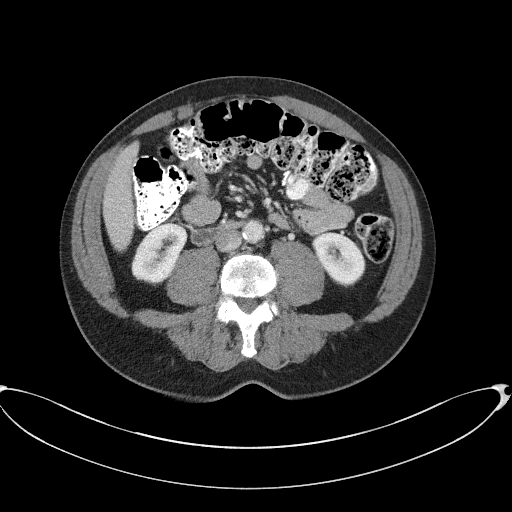
[im 53/82  soft-tissue]
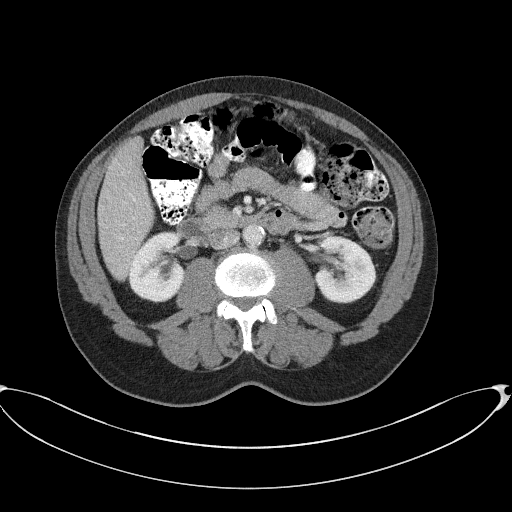
[im 53/82  bone]
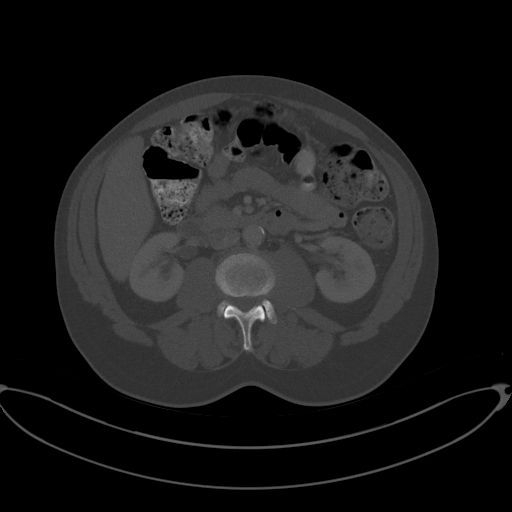
[im 58/82  soft-tissue]
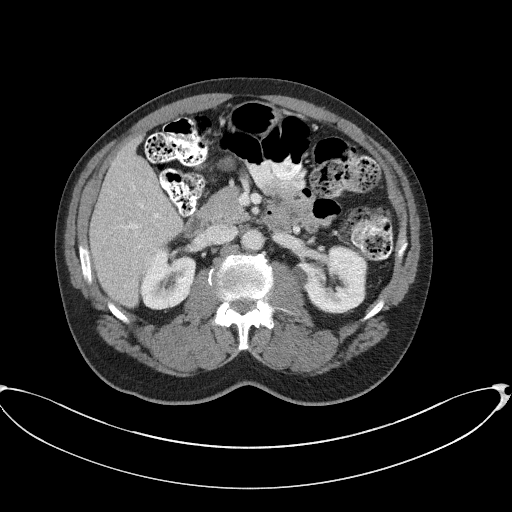
[im 62/82  soft-tissue]
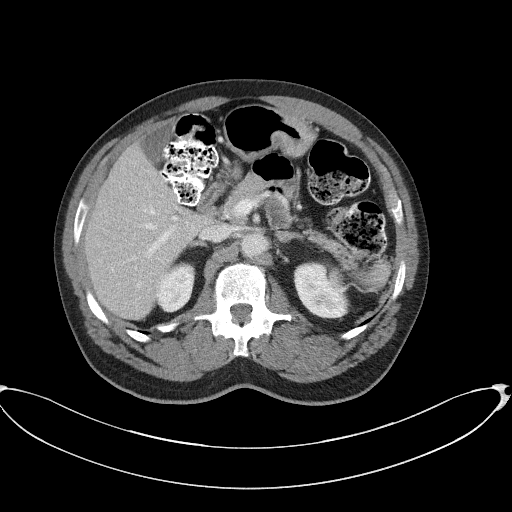
[im 72/82  soft-tissue]
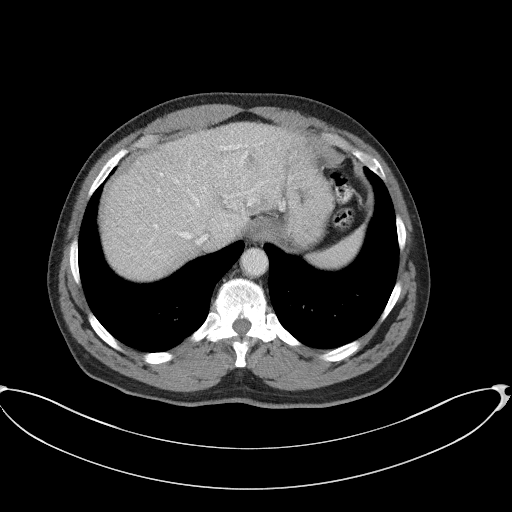
[im 77/82  soft-tissue]
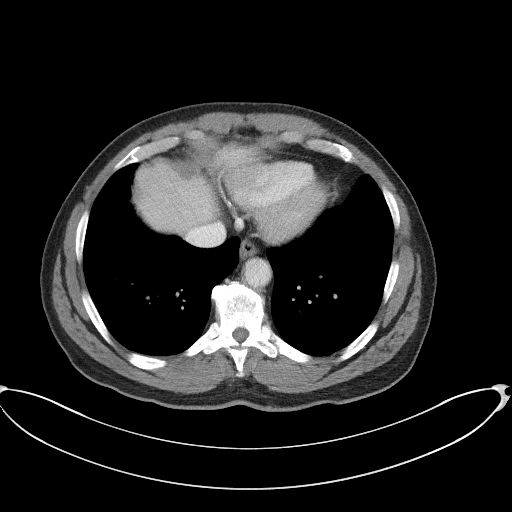

[Series 5: coronal st · coronal · 0.73mm/px · 3 of 109 slices shown]
[im 37/109  soft-tissue]
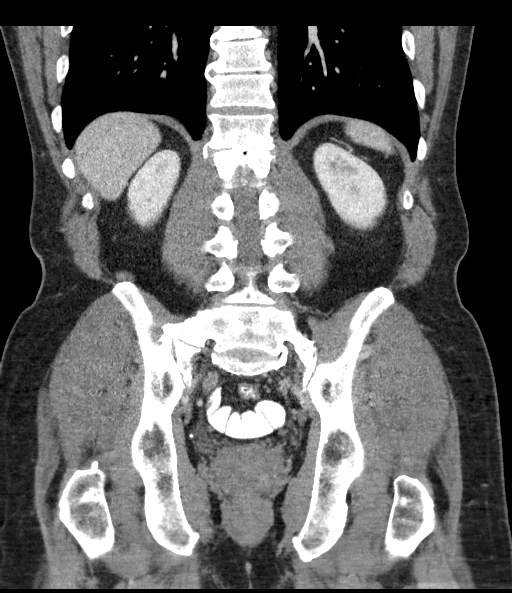
[im 49/109  soft-tissue]
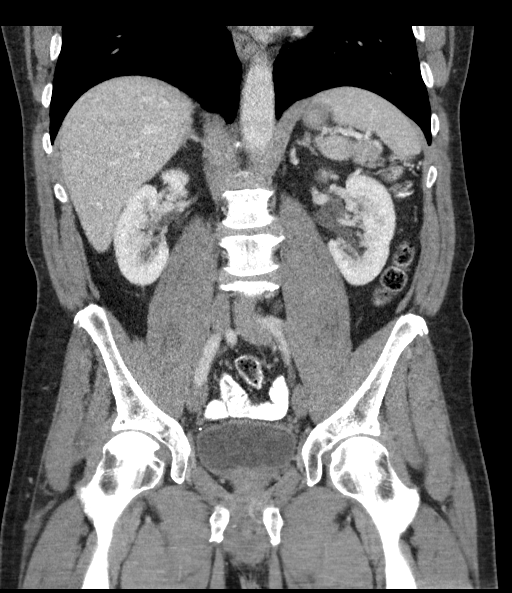
[im 61/109  soft-tissue]
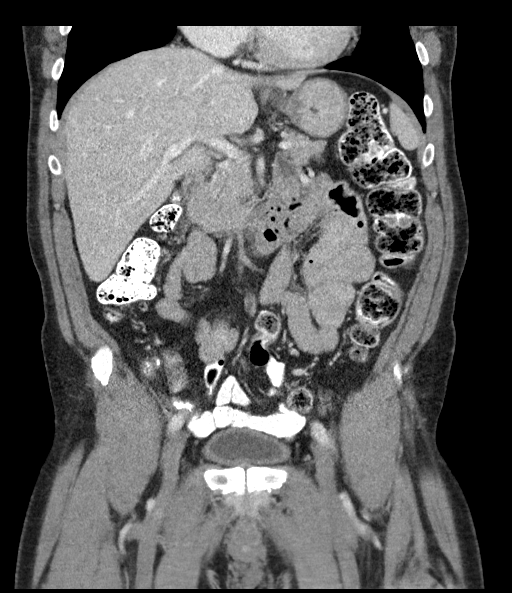

[16 of 46 positions shown; findings below may reference images not displayed]

FINDINGS: Lower chest: Lung bases are clear.

Hepatobiliary: Left hepatic lobe cysts measuring up to 18 mm (series
2/image 12).

Gallbladder is unremarkable. No intrahepatic or extrahepatic ductal
dilatation.

Pancreas: Within normal limits.

Spleen: Within normal limits.

Adrenals/Urinary Tract: Adrenal glands are within normal limits.

15 mm left upper pole renal cyst. Right kidney is within normal
limits. No hydronephrosis.

Mildly thick-walled bladder, favoring chronic bladder outlet
obstruction given additional findings.

Stomach/Bowel: Stomach is within normal limits.

No evidence of bowel obstruction.

Normal appendix (series 2/image 49).

No bowel wall thickening or inflammatory changes.

Vascular/Lymphatic: No evidence of abdominal aortic aneurysm.

Atherosclerotic calcifications of the abdominal aorta and branch
vessels.

No suspicious abdominopelvic lymphadenopathy.

Reproductive: Prostatomegaly, with enlargement of the central gland,
suggesting BPH.

Other: No abdominopelvic ascites.

Musculoskeletal: Degenerative changes of the visualized
thoracolumbar spine.
IMPRESSION: No evidence of bowel obstruction. Normal appendix. No bowel wall
thickening or inflammatory changes.

Prostatomegaly, suggesting BPH.

Mildly thick-walled bladder, suggesting chronic bladder outlet
obstruction.

## 2024-01-06 ENCOUNTER — Encounter (INDEPENDENT_AMBULATORY_CARE_PROVIDER_SITE_OTHER): Payer: Self-pay | Admitting: Internal Medicine

## 2024-01-06 DIAGNOSIS — I2510 Atherosclerotic heart disease of native coronary artery without angina pectoris: Secondary | ICD-10-CM

## 2024-01-08 NOTE — Telephone Encounter (Signed)
 Message reviewed: - patient with CAC (elevated CAC score) and mild AS former Dr. Claudene Patient who presents with questions about statin therapy. - specifically asks the relationship between statins and dementia - appears to be tolerating his current therapy without myalgias. - no plan to therapy is planned.  Please see the MyChart message reply(ies) for my assessment and plan.    This patient gave consent for this Medical Advice Message and is aware that it may result in a bill to [employer], as well as the possibility of receiving a bill for a co-payment or deductible. They are an established patient, but are not seeking medical advice exclusively about a problem treated during an in person or video visit in the last seven days. I did not recommend an in person or video visit within seven days of my reply.    I spent a total of 10 minutes cumulative time within 7 days through [employer].  Stanly DELENA Leavens, MD

## 2024-02-24 ENCOUNTER — Encounter: Payer: Self-pay | Admitting: Emergency Medicine

## 2024-02-24 ENCOUNTER — Ambulatory Visit: Admitting: Emergency Medicine

## 2024-02-24 VITALS — BP 138/78 | HR 67 | Ht 68.0 in | Wt 168.6 lb

## 2024-02-24 DIAGNOSIS — J4520 Mild intermittent asthma, uncomplicated: Secondary | ICD-10-CM

## 2024-02-24 NOTE — Assessment & Plan Note (Signed)
 I am glad you doing very well. Please continue to keep your albuterol  inhaler available to use 2 puffs if needed for shortness of breath, chest tightness, wheezing.  We will refill this for you today.  Throw your old inhaler away. Your flu shot and RSV shot are both up-to-date Get the new COVID-19 vaccine later this month You are due for the pneumococcal pneumonia vaccine.  Would recommend you get the Prevnar-23 vaccine when possible Please call our office for any new respiratory symptoms or breathing problems. Follow with Dr. Shelah in 1 year

## 2024-02-24 NOTE — Patient Instructions (Signed)
 I am glad you doing very well. Please continue to keep your albuterol  inhaler available to use 2 puffs if needed for shortness of breath, chest tightness, wheezing.  We will refill this for you today.  Throw your old inhaler away. Your flu shot and RSV shot are both up-to-date Get the new COVID-19 vaccine later this month You are due for the pneumococcal pneumonia vaccine.  Would recommend you get the Prevnar-23 vaccine when possible Please call our office for any new respiratory symptoms or breathing problems. Follow with Dr. Shelah in 1 year

## 2024-02-24 NOTE — Progress Notes (Signed)
   Subjective:    Patient ID: Angel Conner, male    DOB: [birth year], 73 y.o.   MRN: [id number]  Asthma His past medical history is significant for asthma.    ROV 12/04/22 --[name] is 63, former smoker with history of childhood asthma, hypertension, hyperlipidemia, hep C, BPH.  He has been experiencing dyspnea, often happens at rest.  He is otherwise quite active.  He has a reassuring CT scan of the chest.  We arrange for pulmonary function testing as below.  He has been much better since he gave up his cat, no longer exposed. Very active. Very rare albuterol  use. No other triggers  Pulmonary function testing performed today and reviewed by me.  Spirometry consistent with mixed obstruction and restriction without a bronchodilator response.  Lung volumes confirm restriction based on a decreased TLC.  He was unable to perform DLCO adequately.  ROV 02/24/2024 --follow-up visit for 73 year old gentleman with a history of former tobacco use and longstanding asthma with asthmatic COPD (mixed obstruction and restriction on spirometry).  Also history of hypertension, hyperlipidemia, hep C, BPH, allergic rhinitis (he is sensitive to cats, no longer exposed).  He had COVID-19 in August 2025. He was treated with paxlovid. He feels back at baseline. He is active, goes to the gym. No SOB. No cough. Rare albuterol  use - hasn't used for months. No flares. Flu shot is up to date, needs the next COVID vaccine. His pneumovax was 7 yrs ago. Had the RSV last year.    Review of Systems As per HPI     Objective:   Physical Exam  Vitals:   02/24/24 0948  BP: 138/78  Pulse: 67  SpO2: 97%  Weight: 168 lb 9.6 oz (76.5 kg)  Height: 5' 8 (1.727 m)     Gen: Pleasant, well-nourished, in no distress,  normal affect  ENT: No lesions,  mouth clear,  oropharynx clear, no postnasal drip  Neck: No JVD, no stridor  Lungs: No use of accessory muscles, no crackles or wheezing on normal respiration, clear  B  Cardiovascular: RRR, heart sounds normal, no murmur or gallops, no peripheral edema  Musculoskeletal: No deformities, no cyanosis or clubbing  Neuro: alert, awake, non focal  Skin: Warm, no lesions or rash     Assessment & Plan:   Intermittent asthma I am glad you doing very well. Please continue to keep your albuterol  inhaler available to use 2 puffs if needed for shortness of breath, chest tightness, wheezing.  We will refill this for you today.  Throw your old inhaler away. Your flu shot and RSV shot are both up-to-date Get the new COVID-19 vaccine later this month You are due for the pneumococcal pneumonia vaccine.  Would recommend you get the Prevnar-23 vaccine when possible Please call our office for any new respiratory symptoms or breathing problems. Follow with Dr. Shelah in 1 year     Lamar Shelah, MD, PhD 02/24/2024, 4:46 PM [facility] [phone] or if no answer before 7:00PM call [phone] For any issues after 7:00PM please call eLink [phone]

## 2024-03-02 ENCOUNTER — Encounter: Payer: Self-pay | Admitting: Emergency Medicine

## 2024-03-02 MED ORDER — ALBUTEROL SULFATE HFA 108 (90 BASE) MCG/ACT IN AERS: 1.0000 | INHALATION_SPRAY | Freq: Four times a day (QID) | RESPIRATORY_TRACT | 5 refills | Status: AC | PRN

## 2024-05-18 ENCOUNTER — Ambulatory Visit: Admitting: Emergency Medicine
